# Patient Record
Sex: Female | Born: 1950 | Race: White | Hispanic: No | Marital: Married | Smoking: Current every day smoker
Health system: Southern US, Community
[De-identification: ages and names within clinical notes are randomized; demographics above are authoritative.]

## PROBLEM LIST (undated history)

## (undated) DIAGNOSIS — M797 Fibromyalgia: Secondary | ICD-10-CM

## (undated) DIAGNOSIS — G4733 Obstructive sleep apnea (adult) (pediatric): Secondary | ICD-10-CM

## (undated) DIAGNOSIS — K219 Gastro-esophageal reflux disease without esophagitis: Secondary | ICD-10-CM

## (undated) DIAGNOSIS — G2581 Restless legs syndrome: Secondary | ICD-10-CM

## (undated) HISTORY — PX: OTHER SURGICAL HISTORY: SHX169

## (undated) HISTORY — PX: HERNIA REPAIR: SHX51

## (undated) HISTORY — PX: TOTAL ABDOMINAL HYSTERECTOMY W/ BILATERAL SALPINGOOPHORECTOMY: SHX83

## (undated) HISTORY — PX: ENTEROCELE REPAIR: SHX623

---

## 2015-09-02 ENCOUNTER — Emergency Department (HOSPITAL_COMMUNITY): Payer: BC Managed Care – PPO

## 2015-09-02 ENCOUNTER — Observation Stay (HOSPITAL_COMMUNITY)
Admission: EM | Admit: 2015-09-02 | Discharge: 2015-09-03 | Disposition: A | Discharge status: Home / Self Care | Payer: BC Managed Care – PPO | Attending: Internal Medicine | Admitting: Internal Medicine

## 2015-09-02 ENCOUNTER — Encounter (HOSPITAL_COMMUNITY): Payer: Self-pay | Admitting: Emergency Medicine

## 2015-09-02 DIAGNOSIS — Z6841 Body Mass Index (BMI) 40.0 and over, adult: Secondary | ICD-10-CM | POA: Diagnosis not present

## 2015-09-02 DIAGNOSIS — G454 Transient global amnesia: Secondary | ICD-10-CM | POA: Diagnosis not present

## 2015-09-02 DIAGNOSIS — K219 Gastro-esophageal reflux disease without esophagitis: Secondary | ICD-10-CM

## 2015-09-02 DIAGNOSIS — G2581 Restless legs syndrome: Secondary | ICD-10-CM

## 2015-09-02 DIAGNOSIS — R410 Disorientation, unspecified: Principal | ICD-10-CM

## 2015-09-02 DIAGNOSIS — G4733 Obstructive sleep apnea (adult) (pediatric): Secondary | ICD-10-CM | POA: Diagnosis not present

## 2015-09-02 DIAGNOSIS — M797 Fibromyalgia: Secondary | ICD-10-CM | POA: Diagnosis not present

## 2015-09-02 DIAGNOSIS — F419 Anxiety disorder, unspecified: Secondary | ICD-10-CM | POA: Insufficient documentation

## 2015-09-02 DIAGNOSIS — W11XXXA Fall on and from ladder, initial encounter: Secondary | ICD-10-CM | POA: Insufficient documentation

## 2015-09-02 DIAGNOSIS — R4182 Altered mental status, unspecified: Secondary | ICD-10-CM | POA: Diagnosis present

## 2015-09-02 DIAGNOSIS — Z8673 Personal history of transient ischemic attack (TIA), and cerebral infarction without residual deficits: Secondary | ICD-10-CM | POA: Insufficient documentation

## 2015-09-02 DIAGNOSIS — R55 Syncope and collapse: Secondary | ICD-10-CM | POA: Insufficient documentation

## 2015-09-02 DIAGNOSIS — Z9989 Dependence on other enabling machines and devices: Secondary | ICD-10-CM

## 2015-09-02 HISTORY — DX: Gastro-esophageal reflux disease without esophagitis: K21.9

## 2015-09-02 HISTORY — DX: Restless legs syndrome: G25.81

## 2015-09-02 HISTORY — DX: Morbid (severe) obesity due to excess calories: E66.01

## 2015-09-02 HISTORY — DX: Fibromyalgia: M79.7

## 2015-09-02 HISTORY — DX: Obstructive sleep apnea (adult) (pediatric): G47.33

## 2015-09-02 LAB — COMPREHENSIVE METABOLIC PANEL
ALT: 9 U/L — ABNORMAL LOW (ref 14–54)
AST: 20 U/L (ref 15–41)
Albumin: 3.8 g/dL (ref 3.5–5.0)
Alkaline Phosphatase: 95 U/L (ref 38–126)
Anion gap: 12 (ref 5–15)
BUN: 18 mg/dL (ref 6–20)
CO2: 24 mmol/L (ref 22–32)
Calcium: 9.3 mg/dL (ref 8.9–10.3)
Chloride: 106 mmol/L (ref 101–111)
Creatinine, Ser: 0.78 mg/dL (ref 0.44–1.00)
GFR calc Af Amer: >60 mL/min (ref >60)
GFR calc non Af Amer: >60 mL/min (ref >60)
Glucose, Bld: 116 mg/dL — ABNORMAL HIGH (ref 65–99)
Potassium: 4.4 mmol/L (ref 3.5–5.1)
Sodium: 142 mmol/L (ref 135–145)
Total Bilirubin: 0.4 mg/dL (ref 0.3–1.2)
Total Protein: 7.6 g/dL (ref 6.5–8.1)

## 2015-09-02 LAB — CBC WITH DIFFERENTIAL/PLATELET
Basophils Absolute: 0 10*3/uL (ref 0.0–0.1)
Basophils Relative: 0 %
Eosinophils Absolute: 0 10*3/uL (ref 0.0–0.7)
Eosinophils Relative: 0 %
HCT: 40 % (ref 36.0–46.0)
Hemoglobin: 12.9 g/dL (ref 12.0–15.0)
Lymphocytes Relative: 8 %
Lymphs Abs: 0.7 10*3/uL (ref 0.7–4.0)
MCH: 31.2 pg (ref 26.0–34.0)
MCHC: 32.3 g/dL (ref 30.0–36.0)
MCV: 96.6 fL (ref 78.0–100.0)
Monocytes Absolute: 0.3 10*3/uL (ref 0.1–1.0)
Monocytes Relative: 4 %
Neutro Abs: 8 10*3/uL — ABNORMAL HIGH (ref 1.7–7.7)
Neutrophils Relative %: 88 %
Platelets: 198 10*3/uL (ref 150–400)
RBC: 4.14 MIL/uL (ref 3.87–5.11)
RDW: 13.1 % (ref 11.5–15.5)
WBC: 9 10*3/uL (ref 4.0–10.5)

## 2015-09-02 LAB — URINALYSIS, ROUTINE W REFLEX MICROSCOPIC
Bilirubin Urine: NEGATIVE
Glucose, UA: NEGATIVE mg/dL
Hgb urine dipstick: NEGATIVE
Ketones, ur: NEGATIVE mg/dL
Leukocytes, UA: NEGATIVE
Nitrite: NEGATIVE
Protein, ur: NEGATIVE mg/dL
Specific Gravity, Urine: 1.014 (ref 1.005–1.030)
pH: 5.5 (ref 5.0–8.0)

## 2015-09-02 LAB — RAPID URINE DRUG SCREEN, HOSP PERFORMED
Amphetamines: NOT DETECTED
Barbiturates: NOT DETECTED
Benzodiazepines: NOT DETECTED
Cocaine: NOT DETECTED
Opiates: NOT DETECTED
Tetrahydrocannabinol: NOT DETECTED

## 2015-09-02 LAB — AMMONIA: Ammonia: 20 umol/L (ref 9–35)

## 2015-09-02 MED ORDER — ACETAMINOPHEN 325 MG PO TABS
650.0000 mg | ORAL_TABLET | Freq: Four times a day (QID) | ORAL | Status: DC | PRN
Start: 2015-09-02 — End: 2015-09-03
  Filled 2015-09-02: qty 2

## 2015-09-02 MED ORDER — ONDANSETRON HCL 4 MG/2ML IJ SOLN: 4.0000 mg | Freq: Four times a day (QID) | INTRAMUSCULAR | Status: DC | PRN

## 2015-09-02 MED ORDER — ENOXAPARIN SODIUM 40 MG/0.4ML ~~LOC~~ SOLN
40.0000 mg | SUBCUTANEOUS | Status: DC
  Administered 2015-09-02: 40 mg via SUBCUTANEOUS
  Filled 2015-09-02: qty 0.4

## 2015-09-02 MED ORDER — CARBIDOPA-LEVODOPA 25-100 MG PO TABS
1.0000 | ORAL_TABLET | ORAL | Status: DC
  Administered 2015-09-03: 1 via ORAL
  Filled 2015-09-02: qty 1

## 2015-09-02 MED ORDER — SERTRALINE HCL 100 MG PO TABS
100.0000 mg | ORAL_TABLET | Freq: Two times a day (BID) | ORAL | Status: DC
  Administered 2015-09-02 – 2015-09-03 (×2): 100 mg via ORAL
  Filled 2015-09-02 (×2): qty 1

## 2015-09-02 MED ORDER — ACETAMINOPHEN 650 MG RE SUPP: 650.0000 mg | Freq: Four times a day (QID) | RECTAL | Status: DC | PRN

## 2015-09-02 MED ORDER — ONDANSETRON HCL 4 MG PO TABS: 4.0000 mg | ORAL_TABLET | Freq: Four times a day (QID) | ORAL | Status: DC | PRN

## 2015-09-02 MED ORDER — CARBIDOPA-LEVODOPA 25-100 MG PO TABS
3.0000 | ORAL_TABLET | Freq: Every day | ORAL | Status: DC
  Administered 2015-09-02: 3 via ORAL
  Filled 2015-09-02: qty 3

## 2015-09-02 NOTE — ED Notes (Signed)
Pt oob to br with steady gait 

## 2015-09-02 NOTE — Consult Note (Signed)
Neurology Consultation Reason for Consult: Memory problems Referring Physician: Desiree LucyMessner, J  CC: Memory problems  History is obtained from: Patient, family  HPI: Kathy Lawrence is a 65 y.o. female who was in her normal state of health this morning, and then around 10:30 or 11 her daughter noticed that she was asking the same questions over and over again. She did not remember falling the day prior, and therefore the family called EMS. They state that she seems pretty close to her normal self except that she continues asking the same questions over and over again and seems confused due to this.  They state that she now seems to be getting somewhat better, but still is asking the same questions at times.   LKW: 10:30 AM tpa given?: no, mild symptoms    ROS: A 14 point ROS was performed and is negative except as noted in the HPI.   Medical history: Restless legs  Family history: No history of similar  Social History:  reports that she has been smoking.  She does not have any smokeless tobacco history on file. She reports that she drinks alcohol. She reports that she does not use illicit drugs.   Exam: Current vital signs: BP 127/109 mmHg  Pulse 51  Temp(Src) 98.5 F (36.9 C) (Oral)  Resp 18  SpO2 98% Vital signs in last 24 hours: Temp:  [98.5 F (36.9 C)] 98.5 F (36.9 C) (04/12 1430) Pulse Rate:  [51-63] 51 (04/12 1735) Resp:  [15-23] 18 (04/12 1735) BP: (106-127)/(48-109) 127/109 mmHg (04/12 1735) SpO2:  [97 %-100 %] 98 % (04/12 1735)   Physical Exam  Constitutional: Appears well-developed and well-nourished.  Psych: Affect appropriate to situation Eyes: No scleral injection HENT: No OP obstrucion Head: Normocephalic.  Cardiovascular: Normal rate and regular rhythm.  Respiratory: Effort normal and breath sounds normal to anterior ascultation GI: Soft.  No distension. There is no tenderness.  Skin: WDI  Neuro: Mental Status: Patient is awake, alert, oriented to  person, place, month, and situation. She gives year as 2014 Patient is able to give a clear and coherent history. No signs of aphasia or neglect Cranial Nerves: II: Visual Fields are full. Pupils are equal, round, and reactive to light.   III,IV, VI: EOMI without ptosis or diploplia.  V: Facial sensation is symmetric to temperature VII: Facial movement is symmetric.  VIII: hearing is intact to voice X: Uvula elevates symmetrically XI: Shoulder shrug is symmetric. XII: tongue is midline without atrophy or fasciculations.  Motor: Tone is normal. Bulk is normal. 5/5 strength was present in all four extremities.  Sensory: Sensation is symmetric to light touch and temperature in the arms and legs. Deep Tendon Reflexes: 2+ and symmetric in the biceps and patellae.  Plantars: Toes are downgoing bilaterally.  Cerebellar: FNF and HKS are intact bilaterally   I have reviewed labs in epic and the results pertinent to this consultation are: CMP-unremarkable  I have reviewed the images obtained: CT head-unremarkable  Impression: 65 year old female with severe anterograde memory loss with limited retrograde memory loss as well with otherwise clear sensorium. 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 I do think that an MRI is prudent, but if this is normal and the patient comes back to baseline then I do not feel that any further evaluation as needed.  Recommendations: 1) MRI brain 2) the patient has a negative MRI and returns to normal, then she could be discharged home 3) if the patient remains with short-term  memory loss, I would favor observation until the patient is normal. 4) if there is a stroke on the MRI, then further evaluation for stroke would be indicated    Ritta Slot, MD [facility] [phone]  If 7pm- 7am, please page neurology on call as listed in [facility]ION.

## 2015-09-02 NOTE — ED Notes (Signed)
[facility] with cc of altered level of consciousness. Per EMS report, pt had unwitnessed fall yesterday in her kitchen. Pt unable to recall the event, but is a&ox3 currently with periods of confusion. Per EMS, pt also reporting nausea, no vomitting and has bruise on L Shoulder. Last known normal 1030 this morning per daughter and husband.

## 2015-09-02 NOTE — ED Provider Notes (Signed)
CSN: [id number]     Arrival date & time 09/02/15  1416 History  By signing my name below, I, Tanda RockersMargaux Venter, attest that this documentation has been prepared under the direction and in the presence of Mohawk IndustriesJeff Braeley Buskey, PA-C. Electronically Signed: Tanda RockersMargaux Venter, ED Scribe. 09/02/2015. 3:20 PM.   Chief Complaint  Patient presents with  . Altered Mental Status   The history is provided by the patient, the spouse and a relative. No language interpreter was used.     HPI Comments:  Kathy Lawrence is a 65 y.o. female brought in by ambulance, with no significant PMHx who presents to the Emergency Department for AMS that began this morning. Family reports that pt fell yesterday around 3:30 PM. She was on a small step ladder attempting to reach something off of a high shelf when she fell and landed onto her left shoulder. Husband found pt on the floor complaining of left shoulder pain. Pt did not recall falling at that time and is unsure if she hit her head or had LOC. Husband states that afterwards pt seemed fine. She was not complaining of a headache or any symptoms at that time besides her shoulder pain. Upon waking up this morning pt seemed at baseline. She woke up and made toast for herself but did not eat it. Daughter states that when she saw pt around 10:30 AM she seemed slightly confused but also sick with vomiting/dry heaving. Daughter kept checking up on pt throughout the day and pt seemed to be getting more and more confused. Daughter mentions that pt kept asking her the same questions over and over again. Pt could not recall what day or month it was but did know who the president is and what her own name was, prompting family to call EMS.  Family reports that pt was ambulating without difficulty this morning. Pt does not recall family asking her any questions. She states that she feels fine at the moment. Pt has never had symptoms like this in the past. Denies headache, dizziness, abdominal pain,  diarrhea, chest pain, leg swelling, fever, cough, or any other associated symptoms. She is an occasional EtOH drinker but denies any consumption recently. Denies any illicit drug use. Pt is a current everyday smoker who smokes approximately 0.25 ppd. No hx HTN, HLD, DM, CVA, MI. Pt is not on any anticoagulants.    Past Medical History  Diagnosis Date  . Fibromyalgia   . OSA (obstructive sleep apnea)   . Morbid obesity (HCC)   . GERD (gastroesophageal reflux disease)   . Restless leg syndrome    Past Surgical History  Procedure Laterality Date  . Cesarean section    . Hernia repair    . Enterocele repair    . Mid-urethral sling procedure    . Total abdominal hysterectomy w/ bilateral salpingoophorectomy     Family History  Problem Relation Age of Onset  . Cancer Mother   . Cancer Father   . Breast cancer Other   . Stroke Paternal Grandfather    Social History  Substance Use Topics  . Smoking status: Current Every Day Smoker -- 0.50 packs/day  . Smokeless tobacco: None  . Alcohol Use: Yes     Comment: occasional   OB History    No data available     Review of Systems  Constitutional: Negative for fever.  Respiratory: Negative for cough.   Cardiovascular: Negative for chest pain and leg swelling.  Gastrointestinal: Positive for vomiting. Negative for abdominal pain  and diarrhea.  Musculoskeletal: Positive for arthralgias (left shoulder).  Neurological: Negative for dizziness and headaches.  All other systems reviewed and are negative.  Allergies  Codeine and Erythromycin  Home Medications   Prior to Admission medications   Medication Sig Start Date End Date Taking? Authorizing Provider  Ascorbic Acid (VITAMIN C) 1000 MG tablet Take 1,000 mg by mouth daily.   Yes Historical Provider, MD  carbidopa-levodopa (SINEMET IR) 25-100 MG tablet Take 1-3 tablets by mouth See admin instructions. Takes 1 tab in am and 1 tab in noon and 3 tabs in evening 08/28/15  Yes Historical  Provider, MD  Cholecalciferol (VITAMIN D-1000 MAX ST) 1000 units tablet Take 1,000 Units by mouth daily.   Yes Historical Provider, MD  Homeopathic Products (LEG CRAMP RELIEF) TABS Take 1 tablet by mouth daily.   Yes Historical Provider, MD  ibuprofen (ADVIL,MOTRIN) 200 MG tablet Take 400 mg by mouth every 6 (six) hours as needed for headache or moderate pain.   Yes Historical Provider, MD  LORazepam (ATIVAN) 1 MG tablet Take 1 mg by mouth every 8 (eight) hours as needed for anxiety or sleep.   Yes Historical Provider, MD  Multiple Vitamin (MULTIVITAMIN WITH MINERALS) TABS tablet Take 1 tablet by mouth daily.   Yes Historical Provider, MD  sertraline (ZOLOFT) 100 MG tablet Take 100 mg by mouth 2 (two) times daily. 09/01/15  Yes Historical Provider, MD  vitamin B-12 (CYANOCOBALAMIN) 1000 MCG tablet Take 1,000 mcg by mouth daily.   Yes Historical Provider, MD   BP 99/40 mmHg  Pulse 56  Temp(Src) 98.9 F (37.2 C) (Oral)  Resp 20  Ht  (1.626 m)  Wt 126.417 kg  BMI 47.82 kg/m2  SpO2 96%   Physical Exam  Constitutional: She appears well-developed and well-nourished. No distress.  HENT:  Head: Normocephalic and atraumatic.  Eyes: Conjunctivae and EOM are normal.  Neck: Neck supple. No tracheal deviation present.  Cardiovascular: Normal rate.   Pulmonary/Chest: Effort normal. No respiratory distress. She has rales (right lung).  Very fine crackles right lower lung  Musculoskeletal: Normal range of motion.  Neurological: She is alert. She has normal strength. No cranial nerve deficit or sensory deficit. She displays a negative Romberg sign. Coordination normal. GCS eye subscore is 4. GCS verbal subscore is 5. GCS motor subscore is 6.  Reflex Scores:      Patellar reflexes are 2+ on the right side and 2+ on the left side. Oriented to person, place   Skin: Skin is warm and dry.  Psychiatric: She has a normal mood and affect. Her behavior is normal.  Nursing note and vitals  reviewed.   ED Course  Procedures (including critical care time)  DIAGNOSTIC STUDIES: Oxygen Saturation is 99% on RA, normal by my interpretation.    COORDINATION OF CARE: 2:45 PM-Discussed treatment plan which includes admission to the hospital with pt at bedside and pt agreed to plan.   Labs Review Labs Reviewed  CBC WITH DIFFERENTIAL/PLATELET - Abnormal; Notable for the following:    Neutro Abs 8.0 (*)    All other components within normal limits  COMPREHENSIVE METABOLIC PANEL - Abnormal; Notable for the following:    Glucose, Bld 116 (*)    ALT 9 (*)    All other components within normal limits  URINALYSIS, ROUTINE W REFLEX MICROSCOPIC (NOT AT [facility]) - Abnormal; Notable for the following:    APPearance HAZY (*)    All other components within normal limits  AMMONIA  URINE RAPID DRUG SCREEN, HOSP PERFORMED    Imaging Review Dg Chest 2 View  09/02/2015  CLINICAL DATA:  Altered mental status. EXAM: CHEST  2 VIEW COMPARISON:  None. FINDINGS: The heart size and mediastinal contours are within normal limits. Both lungs are clear. The visualized skeletal structures are unremarkable. IMPRESSION: No active cardiopulmonary disease. Electronically Signed   By: Lupita Raider, M.D.   On: 09/02/2015 15:52   Ct Head Wo Contrast  09/02/2015  CLINICAL DATA:  Larey Seat 2 hours ago after syncopal episode. Trauma to the head. EXAM: CT HEAD WITHOUT CONTRAST TECHNIQUE: Contiguous axial images were obtained from the base of the skull through the vertex without intravenous contrast. COMPARISON:  None. FINDINGS: The brain has normal appearance without evidence of atrophy, old or acute infarction, mass lesion, hemorrhage, hydrocephalus or extra-axial collection. No skull fracture. Sinuses are clear except for mucosal thickening of the right maxillary sinus. IMPRESSION: Normal head CT. Right maxillary sinus mucosal inflammatory change. Electronically Signed   By: Paulina Fusi M.D.   On: 09/02/2015 16:01   Mr  Brain Wo Contrast  09/02/2015  CLINICAL DATA:  Fall from ladder yesterday with possible loss of consciousness. New onset confusion with nausea and vomiting today. EXAM: MRI HEAD WITHOUT CONTRAST TECHNIQUE: Multiplanar, multiecho pulse sequences of the brain and surrounding structures were obtained without intravenous contrast. COMPARISON:  CT head without contrast from the same day. FINDINGS: Mild periventricular T2 changes bilaterally are advanced for age. Remote lacunar infarcts are present within the basal ganglia bilaterally. The ventricles are of normal size. No acute infarct, hemorrhage, or mass lesion is present. No significant extraaxial fluid collection is present. White matter changes extend into the brainstem. Flow is present in the major intracranial arteries. The internal auditory canals are normal bilaterally. The globes and orbits are intact. A fluid level is present in the right maxillary sinus. There is some mucosal thickening of the right maxillary sinus is well. The remaining paranasal sinuses and the mastoid air cells are clear. Skullbase is within normal limits. Midline sagittal images are unremarkable. IMPRESSION: 1. No acute intracranial abnormality. 2. Mild periventricular white matter changes bilaterally are slightly advanced for age. The finding is nonspecific but can be seen in the setting of chronic microvascular ischemia, a demyelinating process such as multiple sclerosis, vasculitis, complicated migraine headaches, or as the sequelae of a prior infectious or inflammatory process. 3. 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 Electronically Signed   By: Marin Roberts M.D.   On: 09/02/2015 18:30   I have personally reviewed and evaluated these images and lab results as part of my medical decision-making.   EKG Interpretation None      MDM   Final diagnoses:  Altered mental status    Labs: CBC with Differential, CMP, Ammonia, UA  Imaging: CT Head,  CXR  Consults: 7:51 PM - Discussed case with Dr. Maryfrances Bunnell who will admit pt for observation.  Therapeutics:  Discharge Meds:   Assessment/Plan: 65 year old female presents today with altered mental status. Patient had a fall yesterday, no head trauma noted after the fall. She was completely normal after the fall and up until today. Patient has had altered mental status since approximately 9 AM this morning when she was first encountered by her daughter. She has no sensory strength or motor deficits on physical exam. Patient continues to forget who I am when I enter the room, she has a very pleasant disposition, she does know why she is here, but  cannot recall the events that her family are telling her about. Patient has no infectious etiology on her exam here in the ED. Due to persistence of symptoms neurology was consult and who will evaluate patient here in the ED.   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 Patient's MRI showed no acute findings, patient continued to show signs of amnesia, but with no worsening or improvement in his symptoms. Hospitalist service was consult for admission and further evaluation.  No signs of infectious, chemical, traumatic etiology here today.    I personally performed the services described in this documentation, which was scribed in my presence. The recorded information has been reviewed and is accurate.     Eyvonne Mechanic, PA-C 09/02/15 1610  Gwyneth Sprout, MD 09/03/15 2238

## 2015-09-02 NOTE — ED Notes (Signed)
Patient transported to X-ray 

## 2015-09-02 NOTE — H&P (Signed)
History and Physical  Patient Name: Kathy Lawrence     ZOX:[id number]    DOB: Jun 29, 1950    DOA: 09/02/2015 Referring physician: Burna Forts, PA-C PCP: No primary care provider on file.      Chief Complaint: Confusion  HPI: Kathy Lawrence is a 65 y.o. female with a past medical history significant for OSA on CPAP, MO, RLS and fibromyalgia who presents with acute confusion for one day.  Her usual state of health until this morning after she got up and she started to feel nauseous. Family relate that daughter found her in the bathroom dry heaving and also confused, "kept asking the same question over and over again", "didn't know what month it was", and "sounded drunk".  They were concerned for stroke, and called 9-1-1.  The patient denied fever, headache, chills, focal weakness, numbness, syncope, loss of consciousness. She had fallen the night before from a stepladder, but appears to be because she was trying to reach for something to far away. She did stay on the floor for about 45 minutes after that until her husband came home, but they both report this is normal for her because of knee pain. She has never had amnesia before. She denies any new medicines. She takes lorazepam and was recently prescribed clonazepam instead, but doesn't take them both at the same time, and has taken none of them recently she thinks. Denies alcohol use.  In the ED, the patient was initially somewhat disoriented to place/time and to her providers in the ER.  She was afebrile, hemodynamically stable.  Na 142, K 4.4, Cr 0.8, LFT normal, WBC 9K, Hgb 12.9, UA without pyuria or bacteria, CXR clear, UDS negative (including a benzodiazepines), alcohol negative.  CT head without contrast was negative. Neurology were consulted who recommended MRI brain and observation until mental status clears. MR brain showed nonspecific periventricular white matter changes consistent with chronic small vessel disease, without focal or specific  findings, and TRH were asked to observ overnight.     Review of Systems:  All other systems negative except as just noted or noted in the history of present illness.  Allergies  Allergen Reactions  . Codeine Other (See Comments)    hallucinations  . Erythromycin Nausea And Vomiting and Rash    Prior to Admission medications   Medication Sig Start Date End Date Taking? Authorizing Provider  Ascorbic Acid (VITAMIN C) 1000 MG tablet Take 1,000 mg by mouth daily.   Yes Historical Provider, MD  carbidopa-levodopa (SINEMET IR) 25-100 MG tablet Take 1-3 tablets by mouth See admin instructions. Takes 1 tab in am and 1 tab in noon and 3 tabs in evening 08/28/15  Yes Historical Provider, MD  Cholecalciferol (VITAMIN D-1000 MAX ST) 1000 units tablet Take 1,000 Units by mouth daily.   Yes Historical Provider, MD  Homeopathic Products (LEG CRAMP RELIEF) TABS Take 1 tablet by mouth daily.   Yes Historical Provider, MD  ibuprofen (ADVIL,MOTRIN) 200 MG tablet Take 400 mg by mouth every 6 (six) hours as needed for headache or moderate pain.   Yes Historical Provider, MD  LORazepam (ATIVAN) 1 MG tablet Take 1 mg by mouth every 8 (eight) hours as needed for anxiety or sleep.   Yes Historical Provider, MD  Multiple Vitamin (MULTIVITAMIN WITH MINERALS) TABS tablet Take 1 tablet by mouth daily.   Yes Historical Provider, MD  sertraline (ZOLOFT) 100 MG tablet Take 100 mg by mouth 2 (two) times daily. 09/01/15  Yes Historical Provider, MD  vitamin B-12 (CYANOCOBALAMIN) 1000 MCG tablet Take 1,000 mcg by mouth daily.   Yes Historical Provider, MD    Past Medical History  Diagnosis Date  . Fibromyalgia   . OSA (obstructive sleep apnea)   . Morbid obesity (HCC)   . GERD (gastroesophageal reflux disease)   . Restless leg syndrome     Past Surgical History  Procedure Laterality Date  . Cesarean section    . Hernia repair    . Enterocele repair    . Mid-urethral sling procedure    . Total abdominal  hysterectomy w/ bilateral salpingoophorectomy      Family history: family history includes Breast cancer in her other; Cancer in her father and mother; Stroke in her paternal grandfather.  Social History: Patient lives with her husband and three children.  She denies alcohol use. She denies illicit drugs. She works as a part-time Armed forces operational officerdental hygienist. She has no history of dementia.       Physical Exam: BP 99/42 mmHg  Pulse 51  Temp(Src) 98.5 F (36.9 C) (Oral)  Resp 16  SpO2 95% General appearance: Well-developed, obese adult female, alert and in no acute distress.  Conversational. Oriented now.   Eyes: Anicteric, conjunctiva pink, lids and lashes normal.     ENT: No nasal deformity, discharge, or epistaxis.  OP moist without lesions.   Lymph: No cervical or supraclavicular lymphadenopathy. Skin: Warm and dry.  Rosacea on face.  No jaundice.  No suspicious rashes or lesions on neck, chest, abdomen, legs or arms. Cardiac: RRR, nl S1-S2, no murmurs appreciated.  Capillary refill is brisk.  JVP not visible.  No LE edema.  Radial pulses 2+ and symmetric.  No carotid bruits. Respiratory: Normal respiratory rate and rhythm.  CTAB without rales or wheezes. Abdomen: Abdomen soft without rigidity.  No TTP. No ascites, distension.   MSK: No deformities or effusions. Neuro: Pupils are 5 mm and reactive to 2 mm.  Extraocular movements are intact, without nystagmus.  Cranial nerve 5 is within normal limits.  Cranial nerve 7 is symmetrical.  Cranial nerve 8 is within normal limits.  Cranial nerves 9 and 10 reveal equal palate elevation.  Cranial nerve 11 reveals sternocleidomastoid strong.  Cranial nerve 12 is midline.  Motor strength testing is 5/5 in the upper and lower extremities bilaterally with normal motor, tone and bulk. Toes are downgoing bilaterally.  Sensory examination is intact to light touch and position.  Romberg maneuver is negative for pathology.  The patient is now oriented to time, place  and person.  Speech is fluent.  Naming is grossly intact.  Recall, recent and remote, as well as general fund of knowledge seem within normal limits with exception of poor memory of events from today.  Attention span and concentration are within normal limits. Psych: Behavior appropriate.  Affect normal.  No evidence of aural or visual hallucinations or delusions.       Labs on Admission:  The metabolic panel shows normal lites collected and renal function. Transaminases and bilirubin are normal. Ammonia normal. Urine drug screen without benzodiazepines or other illicit drugs Urinalysis clear. The complete blood count shows no leukocytosis, anemia, thrombocytopenia.   Radiological Exams on Admission: Personally reviewed: Dg Chest 2 View 09/02/2015  IMPRESSION: No active cardiopulmonary disease.    Ct Head Wo Contrast 09/02/2015  IMPRESSION: Normal head CT. Right maxillary sinus mucosal inflammatory change.    Mr Brain Wo Contrast 09/02/2015  IMPRESSION: 1. No acute intracranial abnormality. 2. Mild periventricular white matter changes  bilaterally are slightly advanced for age. The finding is nonspecific but can be seen in the setting of chronic microvascular ischemia, a demyelinating process such as multiple sclerosis, vasculitis, complicated migraine headaches, or as the sequelae of a prior infectious or inflammatory process. 3. 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 Electronically Signed   By: Marin Roberts M.D.   On: 09/02/2015 18:30       Assessment/Plan 1. Amnesia, confusion:  This is new.  TGA suspected.  No source of delirium clear, specifically, no new medications/alcohol/toxins, no medication overuse, electrolytes normal, no anemia, MRI brain without specific findings, no evidence of infection.  Discussed with on-call Neurology overnight, who doubt non-specific white matter changes on MRI warrant LP or further workup, for which I agree, given they  do not correlate to patient's symptoms. -Avoid benzodiazepines    2. OSA:  -Continue home CPAP  3. Restless leg syndrome:  -Continue home Sinemet  4. GERD:  -Continue home PPI  5. Anxiety:  -Hold all benzodiazepines while inpatient, so as not to cloud picture      DVT PPx: Lovenox Diet: Regular Consultants: Neurology Code Status: FULL Family Communication: Sons and husband present at bedside  Medical decision making: What exists of the patient's previous chart and outside records from [facility] were reviewed in depth and the case was discussed with Burna Forts and Dr. Roseanne Reno from Neurology. Patient seen 8:38 PM on 09/02/2015.  Disposition Plan:  I recommend admission to medical surgical bed, observation status.  Clinical condition: stable.  Anticipate observation overnight.  Will review MRI results with attending Neurologist in morning, and if agree with overnight coverage that no further workup necessary, and patient returns to baseline mental status without new fever, will discharge home tomorrow.      Alberteen Sam [facility] Pager [phone]

## 2015-09-02 NOTE — ED Notes (Signed)
Attempted to call report

## 2015-09-02 NOTE — ED Notes (Signed)
Returns from ct.

## 2015-09-02 NOTE — ED Notes (Signed)
Pt to MRI

## 2015-09-03 DIAGNOSIS — R410 Disorientation, unspecified: Secondary | ICD-10-CM | POA: Diagnosis not present

## 2015-09-03 DIAGNOSIS — G2581 Restless legs syndrome: Secondary | ICD-10-CM | POA: Diagnosis not present

## 2015-09-03 DIAGNOSIS — G4733 Obstructive sleep apnea (adult) (pediatric): Secondary | ICD-10-CM | POA: Diagnosis not present

## 2015-09-03 MED ORDER — ASPIRIN 81 MG PO TBEC: 81.0000 mg | DELAYED_RELEASE_TABLET | Freq: Every day | ORAL | Status: DC

## 2015-09-03 MED ORDER — ASPIRIN EC 81 MG PO TBEC: 81.0000 mg | DELAYED_RELEASE_TABLET | Freq: Every day | ORAL | Status: DC

## 2015-09-03 NOTE — Discharge Summary (Signed)
[facility] Discharge Summary   Patient: Kathy Lawrence BJY:[id number]   PCP: Raynelle Jan., MD DOB: 1950-12-06   Date of admission: 09/02/2015   Date of discharge:  09/03/2015    Discharge Diagnoses:  Principal Problem:   Confusion Active Problems:   OSA on CPAP   Restless leg syndrome   GERD (gastroesophageal reflux disease)  Recommendations for Outpatient Follow-up:  1. Follow-up with PCP in one week  2. Do not drive or operate any machinery until cleared by PCP  Follow-up Information    Follow up with Raynelle Jan., MD. Schedule an appointment as soon as possible for a visit in 1 week.   Specialty:  Family Medicine   Contact information:   [address] [place] [address] [phone]      Diet recommendation: Cardiac diet  Activity: The patient is advised to gradually reintroduce usual activities.  Discharge Condition: good  History of present illness: As per the H and P dictated on admission, "Kathy Lawrence is a 65 y.o. female with a past medical history significant for OSA on CPAP, MO, RLS and fibromyalgia who presents with acute confusion for one day.  Her usual state of health until this morning after she got up and she started to feel nauseous. Family relate that daughter found her in the bathroom dry heaving and also confused, "kept asking the same question over and over again", "didn't know what month it was", and "sounded drunk". They were concerned for stroke, and called 9-1-1.  The patient denied fever, headache, chills, focal weakness, numbness, syncope, loss of consciousness. She had fallen the night before from a stepladder, but appears to be because she was trying to reach for something to far away. She did stay on the floor for about 45 minutes after that until her husband came home, but they both report this is normal for her because of knee pain. She has never had amnesia before. She denies any new medicines. She takes lorazepam and was  recently prescribed clonazepam instead, but doesn't take them both at the same time, and has taken none of them recently she thinks. Denies alcohol use.  In the ED, the patient was initially somewhat disoriented to place/time and to her providers in the ER. She was afebrile, hemodynamically stable. Na 142, K 4.4, Cr 0.8, LFT normal, WBC 9K, Hgb 12.9, UA without pyuria or bacteria, CXR clear, UDS negative (including a benzodiazepines), alcohol negative. CT head without contrast was negative. Neurology were consulted who recommended MRI brain and observation until mental status clears. MR brain showed nonspecific periventricular white matter changes consistent with chronic small vessel disease, without focal or specific findings, and TRH were asked to observ overnight."  Hospital Course:  Summary of her active problems in the hospital is as following.  Principal Problem:   Confusion Significant neurological deficit. Likely associated with medication. MRI is negative for any evidence of stroke or any other acute abnormality. Patient appears to have prior evidence of lacunar stroke and therefore I will discharge the patient on aspirin.  Active Problems:   OSA on CPAP Continue Cipro. Recommend patient to follow up with sleep therapist as scheduled.    Restless leg syndrome current presentation could likely be secondary to medication. At present not making any changes but recommend PCP to evaluate for potential decrease in the dose of medications.  All other chronic medical condition were stable during the hospitalization.  Patient was ambulatory without any assistance. On the day of the discharge the patient's  mentation back to baseline, and no other acute medical condition were reported by patient. the patient was felt safe to be discharge at home with family.  Procedures and Results:  none   Consultations:  Neurology  DISCHARGE MEDICATION: Current Discharge Medication List      START taking these medications   Details  aspirin EC 81 MG EC tablet Take 1 tablet (81 mg total) by mouth daily. Qty: 30 tablet, Refills: 0      CONTINUE these medications which have NOT CHANGED   Details  Ascorbic Acid (VITAMIN C) 1000 MG tablet Take 1,000 mg by mouth daily.    carbidopa-levodopa (SINEMET IR) 25-100 MG tablet Take 1-3 tablets by mouth See admin instructions. Takes 1 tab in am and 1 tab in noon and 3 tabs in evening    Cholecalciferol (VITAMIN D-1000 MAX ST) 1000 units tablet Take 1,000 Units by mouth daily.    Homeopathic Products (LEG CRAMP RELIEF) TABS Take 1 tablet by mouth daily.    ibuprofen (ADVIL,MOTRIN) 200 MG tablet Take 400 mg by mouth every 6 (six) hours as needed for headache or moderate pain.    LORazepam (ATIVAN) 1 MG tablet Take 1 mg by mouth every 8 (eight) hours as needed for anxiety or sleep.    Multiple Vitamin (MULTIVITAMIN WITH MINERALS) TABS tablet Take 1 tablet by mouth daily.    sertraline (ZOLOFT) 100 MG tablet Take 100 mg by mouth 2 (two) times daily.    vitamin B-12 (CYANOCOBALAMIN) 1000 MCG tablet Take 1,000 mcg by mouth daily.       Allergies  Allergen Reactions  . Codeine Other (See Comments)    hallucinations  . Erythromycin Nausea And Vomiting and Rash   Discharge Instructions    Diet general    Complete by:  As directed      Discharge instructions    Complete by:  As directed   It is important that you read following instructions as well as go over your medication list with RN to help you understand your care after this hospitalization.  Discharge Instructions: Please follow-up with PCP in one week  Please request your primary care physician to go over all Hospital Tests and Procedure/Radiological results at the follow up,  Please get all Hospital records sent to your PCP by signing hospital release before you go home.   Do not drive, operating heavy machinery, perform activities at heights, swimming or participation  in water activities or provide baby sitting services while your are on Pain, Sleep and Anxiety Medications; until you have been seen by Primary Care Physician or a Neurologist and advised to do so again. Do not take more than prescribed Pain, Sleep and Anxiety Medications. You were cared for by a hospitalist during your hospital stay. If you have any questions about your discharge medications or the care you received while you were in the hospital after you are discharged, you can call the unit and ask to speak with the hospitalist on call if the hospitalist that took care of you is not available.  Once you are discharged, your primary care physician will handle any further medical issues. Please note that NO REFILLS for any discharge medications will be authorized once you are discharged, as it is imperative that you return to your primary care physician (or establish a relationship with a primary care physician if you do not have one) for your aftercare needs so that they can reassess your need for medications and monitor your lab values.  You Must read complete instructions/literature along with all the possible adverse reactions/side effects for all the Medicines you take and that have been prescribed to you. Take any new Medicines after you have completely understood and accept all the possible adverse reactions/side effects. Wear Seat belts while driving. If you have smoked or chewed Tobacco in the last 2 yrs please stop smoking and/or stop any Recreational drug use.     Increase activity slowly    Complete by:  As directed           Discharge Exam: Filed Weights   09/02/15 2108  Weight: 126.417 kg (278 lb 11.2 oz)   Filed Vitals:   09/03/15 0557 09/03/15 1019  BP: 104/45 90/48  Pulse: 62 60  Temp: 98.5 F (36.9 C) 99 F (37.2 C)  Resp: 20 18   General: Appear in no distress, no Rash; Oral Mucosa moist. Cardiovascular: S1 and S2 Present, no Murmur, no JVD Respiratory: Bilateral Air  entry present and Clear to Auscultation, no Crackles, no wheezes Abdomen: Bowel Sound present, Soft and no tenderness Extremities: no Pedal edema, no calf tenderness Neurology: Grossly no focal neuro deficit.  The results of significant diagnostics from this hospitalization (including imaging, microbiology, ancillary and laboratory) are listed below for reference.    Significant Diagnostic Studies: Dg Chest 2 View  09/02/2015  CLINICAL DATA:  Altered mental status. EXAM: CHEST  2 VIEW COMPARISON:  None. FINDINGS: The heart size and mediastinal contours are within normal limits. Both lungs are clear. The visualized skeletal structures are unremarkable. IMPRESSION: No active cardiopulmonary disease. Electronically Signed   By: Lupita RaiderJames  Green Jr, M.D.   On: 09/02/2015 15:52   Ct Head Wo Contrast  09/02/2015  CLINICAL DATA:  Larey SeatFell 2 hours ago after syncopal episode. Trauma to the head. EXAM: CT HEAD WITHOUT CONTRAST TECHNIQUE: Contiguous axial images were obtained from the base of the skull through the vertex without intravenous contrast. COMPARISON:  None. FINDINGS: The brain has normal appearance without evidence of atrophy, old or acute infarction, mass lesion, hemorrhage, hydrocephalus or extra-axial collection. No skull fracture. Sinuses are clear except for mucosal thickening of the right maxillary sinus. IMPRESSION: Normal head CT. Right maxillary sinus mucosal inflammatory change. Electronically Signed   By: Paulina FusiMark  Shogry M.D.   On: 09/02/2015 16:01   Mr Brain Wo Contrast  09/02/2015  CLINICAL DATA:  Fall from ladder yesterday with possible loss of consciousness. New onset confusion with nausea and vomiting today. EXAM: MRI HEAD WITHOUT CONTRAST TECHNIQUE: Multiplanar, multiecho pulse sequences of the brain and surrounding structures were obtained without intravenous contrast. COMPARISON:  CT head without contrast from the same day. FINDINGS: Mild periventricular T2 changes bilaterally are advanced  for age. Remote lacunar infarcts are present within the basal ganglia bilaterally. The ventricles are of normal size. No acute infarct, hemorrhage, or mass lesion is present. No significant extraaxial fluid collection is present. White matter changes extend into the brainstem. Flow is present in the major intracranial arteries. The internal auditory canals are normal bilaterally. The globes and orbits are intact. A fluid level is present in the right maxillary sinus. There is some mucosal thickening of the right maxillary sinus is well. The remaining paranasal sinuses and the mastoid air cells are clear. Skullbase is within normal limits. Midline sagittal images are unremarkable. IMPRESSION: 1. No acute intracranial abnormality. 2. Mild periventricular white matter changes bilaterally are slightly advanced for age. The finding is nonspecific but can be seen in the setting of chronic  microvascular ischemia, a demyelinating process such as multiple sclerosis, vasculitis, complicated migraine headaches, or as the sequelae of a prior infectious or inflammatory process. 3. 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 Electronically Signed   By: Marin Roberts M.D.   On: 09/02/2015 18:30    Microbiology: No results found for this or any previous visit (from the past 240 hour(s)).   Labs: CBC:  Recent Labs Lab 09/02/15 1502  WBC 9.0  NEUTROABS 8.0*  HGB 12.9  HCT 40.0  MCV 96.6  PLT 198   Basic Metabolic Panel:  Recent Labs Lab 09/02/15 1502  NA 142  K 4.4  CL 106  CO2 24  GLUCOSE 116*  BUN 18  CREATININE 0.78  CALCIUM 9.3   Liver Function Tests:  Recent Labs Lab 09/02/15 1502  AST 20  ALT 9*  ALKPHOS 95  BILITOT 0.4  PROT 7.6  ALBUMIN 3.8   No results for input(s): LIPASE, AMYLASE in the last 168 hours.  Recent Labs Lab 09/02/15 1501  AMMONIA 20   Cardiac Enzymes: No results for input(s): CKTOTAL, CKMB, CKMBINDEX, TROPONINI in the last 168 hours. BNP  (last 3 results) No results for input(s): BNP in the last 8760 hours. CBG: No results for input(s): GLUCAP in the last 168 hours. Time spent: 30 minutes  Signed:  Lynden Oxford  [facility]  09/03/2015  , 12:47 PM

## 2015-09-03 NOTE — Care Management Note (Addendum)
Case Management Note  Patient Details  Name: Josefine ClassSharon Edrington MRN: [id number] Date of Birth: [birth year]  Subjective/Objective:   Patient admitted with confusion. She is from home with her husband. [facility].                Action/Plan: Plan is for patient to discharge home today with self care. No further needs per CM.   Expected Discharge Date:  09/03/15               Expected Discharge Plan:  Home/Self Care  In-House Referral:     Discharge planning Services     Post Acute Care Choice:    Choice offered to:     DME Arranged:    DME Agency:     HH Arranged:    HH Agency:     Status of Service:  Completed, signed off  Medicare Important Message Given:    Date Medicare IM Given:    Medicare IM give by:    Date Additional Medicare IM Given:    Additional Medicare Important Message give by:     If discussed at Long Length of Stay Meetings, dates discussed:    Additional Comments:  Kermit BaloKelli F Symphonie Schneiderman, RN 09/03/2015, 11:35 AM

## 2015-09-03 NOTE — Progress Notes (Signed)
Pt discharged home with husband. IV removed. Discharge instructions given. [facility] called to patient's pharm. Pt questions asked and answered. Volunteer services to escort pt to exit via wheelchair. Kathy Lawrence

## 2018-07-13 ENCOUNTER — Other Ambulatory Visit: Payer: Self-pay | Admitting: Neurosurgery

## 2018-07-13 DIAGNOSIS — M5442 Lumbago with sciatica, left side: Secondary | ICD-10-CM

## 2018-07-22 ENCOUNTER — Ambulatory Visit
Admission: RE | Admit: 2018-07-22 | Discharge: 2018-07-22 | Disposition: A | Discharge status: Home / Self Care | Payer: Self-pay | Source: Ambulatory Visit | Attending: Neurosurgery | Admitting: Neurosurgery

## 2018-07-22 DIAGNOSIS — M5442 Lumbago with sciatica, left side: Secondary | ICD-10-CM

## 2021-02-05 IMAGING — MR MR LUMBAR SPINE W/O CM
4 of 5 series · 24 of 48 positions shown · non-contrast
Comparison: None available.

CLINICAL DATA: Initial evaluation for chronic low back pain
radiating into the lower extremities bilaterally with associated
weakness and numbness.

EXAM:
MRI LUMBAR SPINE WITHOUT CONTRAST
TECHNIQUE: Multiplanar, multisequence MR imaging of the lumbar spine was
performed. No intravenous contrast was administered.

[Series 4: T2 · sagittal · 4.0mm · 0.55mm/px · 6 of 15 slices shown (1 of 2)]
[im 1/15]
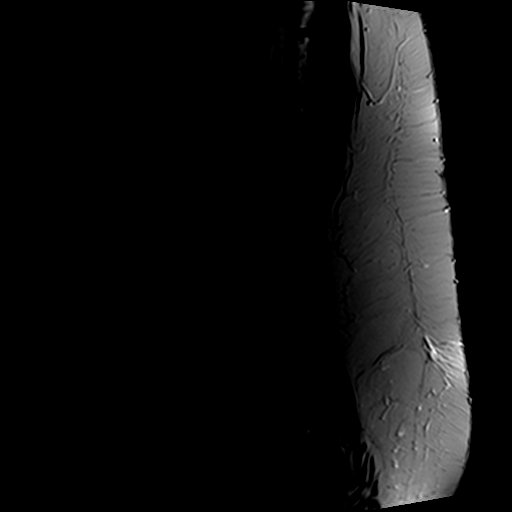
[im 3/15]
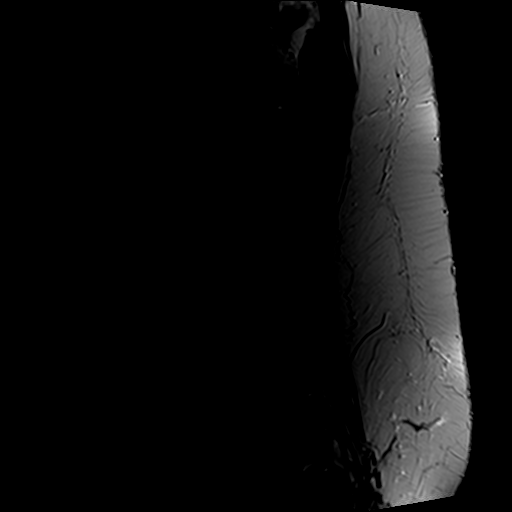
[im 6/15]
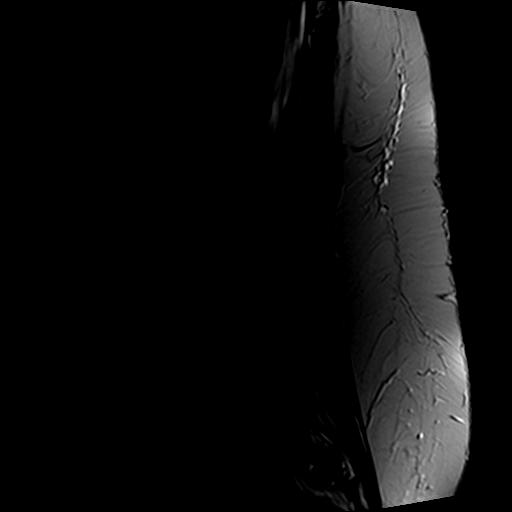
[im 9/15]
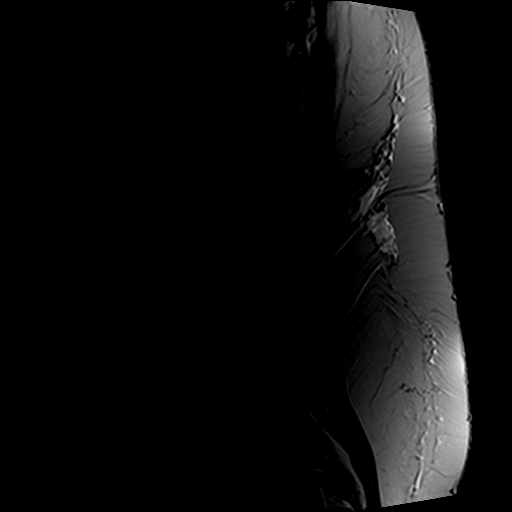
[im 12/15]
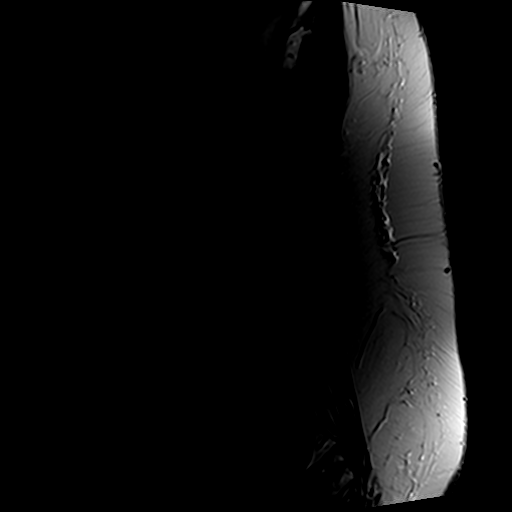
[im 15/15]
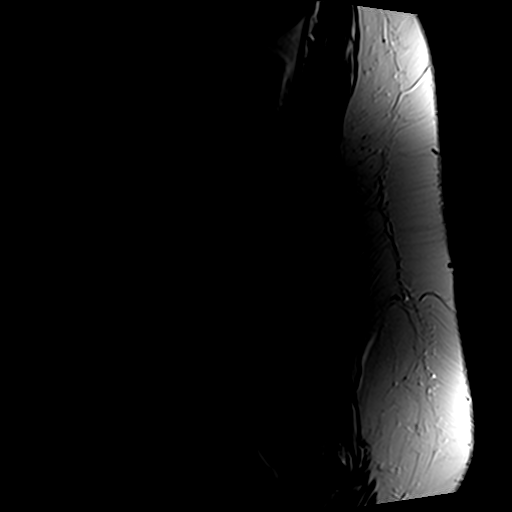

[Series 5: T1 · sagittal · 4.0mm · 0.55mm/px · 6 of 15 slices shown (1 of 2)]
[im 1/15]
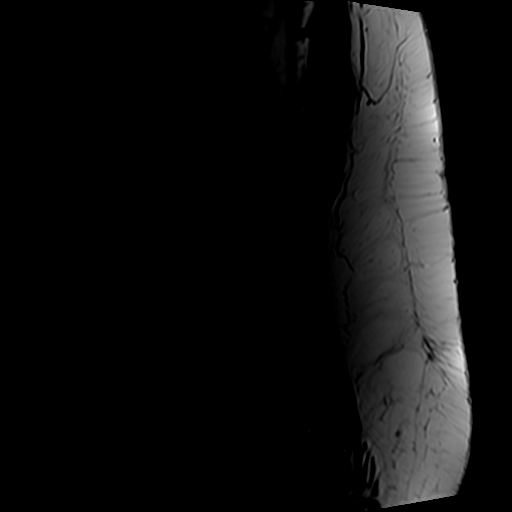
[im 3/15]
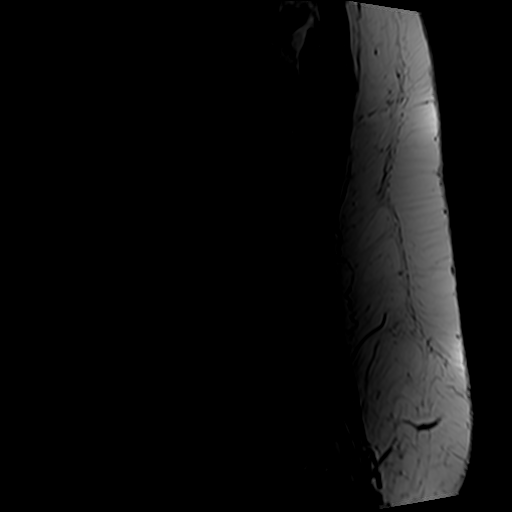
[im 6/15]
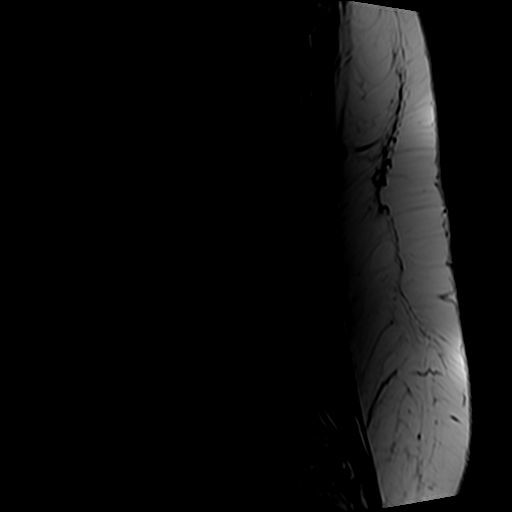
[im 9/15]
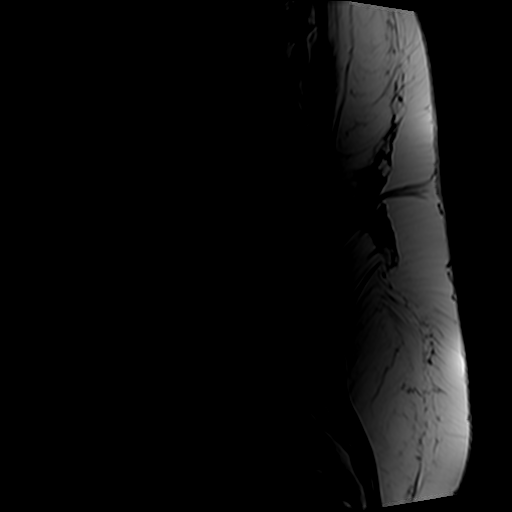
[im 12/15]
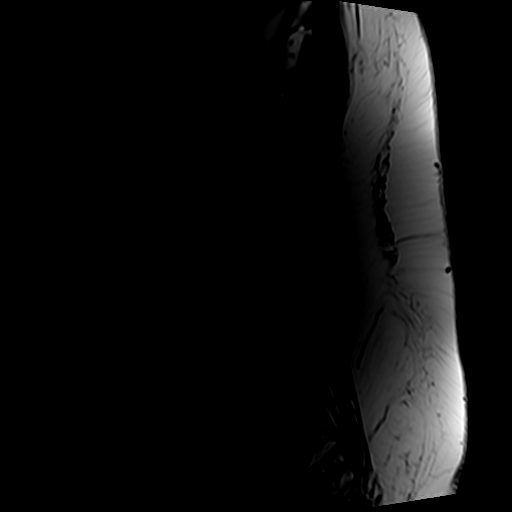
[im 15/15]
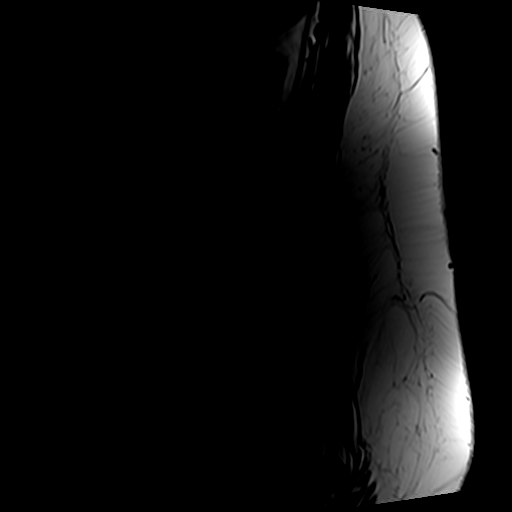

[Series 7: T2 · axial · 4.0mm · 0.70mm/px · z∈[-24,+170]mm · 9 of 35 slices shown (2 of 2)]
[im 1/35]
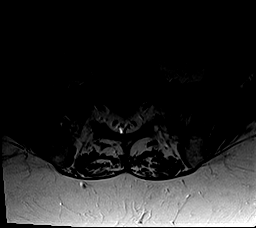
[im 5/35]
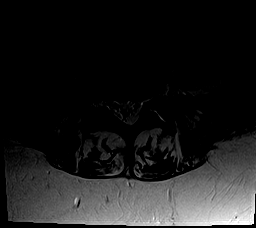
[im 10/35]
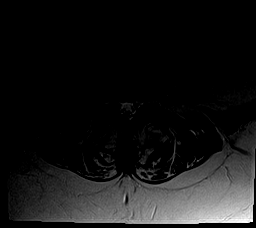
[im 15/35]
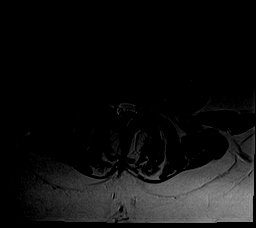
[im 18/35]
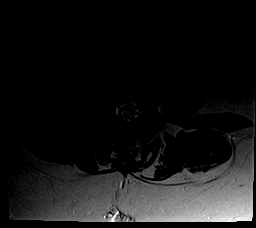
[im 20/35]
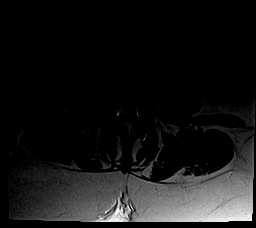
[im 25/35]
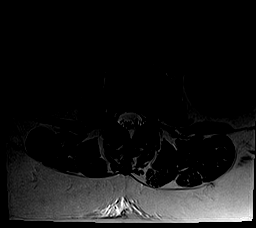
[im 30/35]
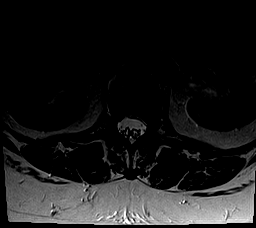
[im 35/35]
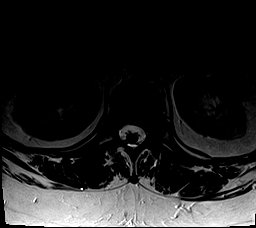

[Series 8: T1 · axial · 4.0mm · 0.35mm/px · z∈[-3,+146]mm · 3 of 35 slices shown (2 of 2)]
[im 5/35]
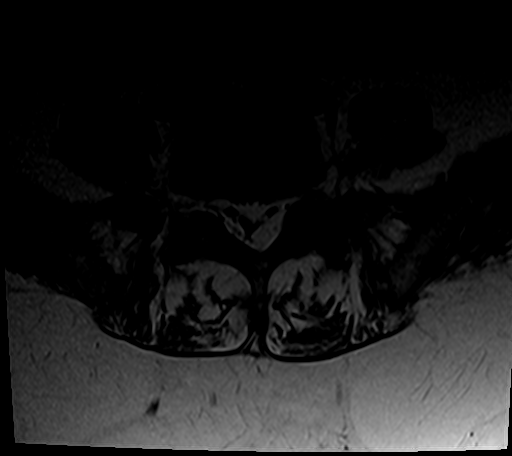
[im 18/35]
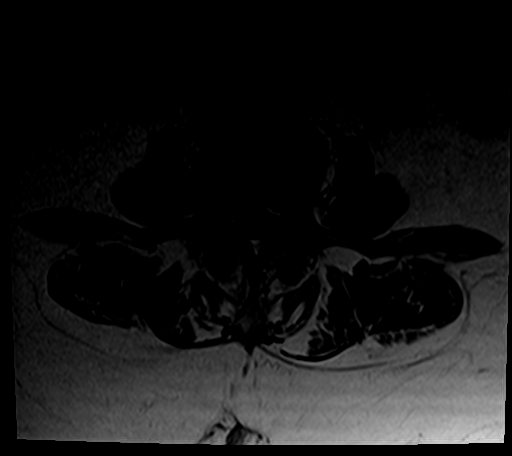
[im 30/35]
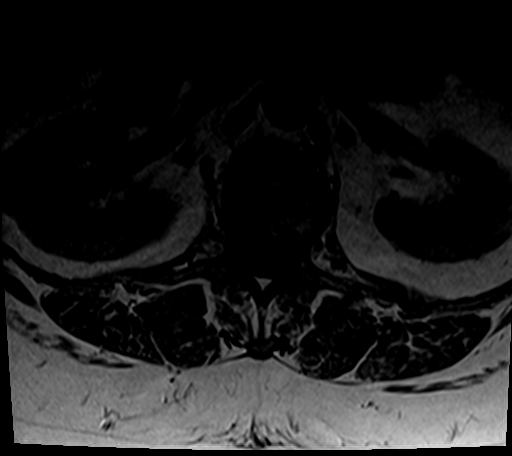

[24 of 48 positions shown; findings below may reference images not displayed]

FINDINGS: Segmentation: Standard. Lowest well-formed disc labeled the L5-S1
level.

Alignment: 4 mm anterolisthesis of L4 on L5 and L5 on S1. Underlying
mild levoscoliosis. Alignment otherwise normal with preservation of
the normal lumbar lordosis.

Vertebrae: Vertebral body heights maintained without evidence for
acute or chronic fracture. Bone marrow signal intensity within
normal limits. Few scattered benign hemangiomas noted. No worrisome
osseous lesions. Reactive endplate changes noted about the L1-2
interspace. No other abnormal marrow edema.

Conus medullaris and cauda equina: Conus extends to the L1 level.
Conus and cauda equina appear normal.

Paraspinal and other soft tissues: Paraspinous soft tissues within
normal limits. Visualized visceral structures unremarkable.

Disc levels:

T10-11: Seen only on sagittal projection. Mild diffuse disc bulge
with disc desiccation. Bilateral facet hypertrophy. No significant
stenosis.

T11-12: Seen only on sagittal projection. Mild diffuse disc bulge.
No significant stenosis.

T12-L1: Mild left eccentric disc bulge. Superimposed shallow left
foraminal disc protrusion noted (series 8, image 3). No significant
canal or foraminal stenosis.

L1-2: Mild diffuse disc bulge with disc desiccation with chronic
reactive endplate changes. Mild facet and ligament flavum
hypertrophy. No significant canal or lateral recess stenosis. Mild
bilateral L1 foraminal narrowing. No impingement.

L2-3: Mild disc bulge. Mild to moderate facet and ligament flavum
hypertrophy, slightly worse on the left. No significant spinal
stenosis. Mild left greater than right L2 foraminal narrowing. No
frank impingement.

L3-4: Mild diffuse disc bulge. Left foraminal/extraforaminal annular
fissure noted. Mild facet and ligament flavum hypertrophy. No
significant spinal stenosis. Mild bilateral L3 foraminal narrowing.
No frank impingement.

L4-5: Anterolisthesis. Mild diffuse disc bulge with disc
desiccation. Moderate facet and ligament flavum hypertrophy,
slightly worse on the right. Epidural lipomatosis. Resultant mild to
moderate bilateral lateral recess narrowing, right worse than left.
Foramina remain patent.

L5-S1: Anterolisthesis. Mild diffuse disc bulge with disc
desiccation. Moderate left with mild right facet hypertrophy.
Associated small joint effusions. Epidural lipomatosis compresses
the distal thecal sac. No other significant spinal stenosis.
Moderate left L5 foraminal narrowing.
IMPRESSION: 1. 4 mm anterolisthesis of L4 on L5 with associated disc bulge and
facet arthropathy, resulting in mild to moderate right worse than
left lateral recess stenosis.
2. Chronic 4 mm facet mediated anterolisthesis of L5 on S1 with
resultant moderate left L5 foraminal stenosis.
3. Disc bulging with facet hypertrophy at L1-2 through L3-4 with
resultant mild bilateral L1 through L3 foraminal narrowing. No frank
impingement.

## 2023-08-18 ENCOUNTER — Encounter (HOSPITAL_COMMUNITY): Payer: Self-pay

## 2023-08-18 ENCOUNTER — Observation Stay (HOSPITAL_COMMUNITY)
Admission: EM | Admit: 2023-08-18 | Discharge: 2023-08-19 | Disposition: A | Discharge status: Home / Self Care | Attending: Internal Medicine | Admitting: Internal Medicine

## 2023-08-18 ENCOUNTER — Emergency Department (HOSPITAL_COMMUNITY)

## 2023-08-18 DIAGNOSIS — I50810 Right heart failure, unspecified: Secondary | ICD-10-CM | POA: Insufficient documentation

## 2023-08-18 DIAGNOSIS — I2721 Secondary pulmonary arterial hypertension: Secondary | ICD-10-CM | POA: Insufficient documentation

## 2023-08-18 DIAGNOSIS — D649 Anemia, unspecified: Secondary | ICD-10-CM | POA: Insufficient documentation

## 2023-08-18 DIAGNOSIS — G2581 Restless legs syndrome: Secondary | ICD-10-CM | POA: Diagnosis not present

## 2023-08-18 DIAGNOSIS — J9611 Chronic respiratory failure with hypoxia: Secondary | ICD-10-CM | POA: Insufficient documentation

## 2023-08-18 DIAGNOSIS — G4733 Obstructive sleep apnea (adult) (pediatric): Secondary | ICD-10-CM | POA: Diagnosis not present

## 2023-08-18 DIAGNOSIS — Z79899 Other long term (current) drug therapy: Secondary | ICD-10-CM | POA: Diagnosis not present

## 2023-08-18 DIAGNOSIS — E876 Hypokalemia: Secondary | ICD-10-CM | POA: Insufficient documentation

## 2023-08-18 DIAGNOSIS — F17200 Nicotine dependence, unspecified, uncomplicated: Secondary | ICD-10-CM | POA: Insufficient documentation

## 2023-08-18 DIAGNOSIS — R55 Syncope and collapse: Secondary | ICD-10-CM | POA: Diagnosis present

## 2023-08-18 DIAGNOSIS — F1090 Alcohol use, unspecified, uncomplicated: Secondary | ICD-10-CM | POA: Insufficient documentation

## 2023-08-18 DIAGNOSIS — K922 Gastrointestinal hemorrhage, unspecified: Secondary | ICD-10-CM | POA: Insufficient documentation

## 2023-08-18 DIAGNOSIS — F32A Depression, unspecified: Secondary | ICD-10-CM | POA: Diagnosis not present

## 2023-08-18 DIAGNOSIS — K219 Gastro-esophageal reflux disease without esophagitis: Secondary | ICD-10-CM | POA: Diagnosis not present

## 2023-08-18 DIAGNOSIS — I951 Orthostatic hypotension: Secondary | ICD-10-CM

## 2023-08-18 LAB — CBC WITH DIFFERENTIAL/PLATELET
Abs Immature Granulocytes: 0.04 10*3/uL (ref 0.00–0.07)
Basophils Absolute: 0.1 10*3/uL (ref 0.0–0.1)
Basophils Relative: 1 %
Eosinophils Absolute: 0.1 10*3/uL (ref 0.0–0.5)
Eosinophils Relative: 2 %
HCT: 31.8 % — ABNORMAL LOW (ref 36.0–46.0)
Hemoglobin: 9.1 g/dL — ABNORMAL LOW (ref 12.0–15.0)
Immature Granulocytes: 1 %
Lymphocytes Relative: 20 %
Lymphs Abs: 1.8 10*3/uL (ref 0.7–4.0)
MCH: 26.5 pg (ref 26.0–34.0)
MCHC: 28.6 g/dL — ABNORMAL LOW (ref 30.0–36.0)
MCV: 92.7 fL (ref 80.0–100.0)
Monocytes Absolute: 0.5 10*3/uL (ref 0.1–1.0)
Monocytes Relative: 5 %
Neutro Abs: 6.4 10*3/uL (ref 1.7–7.7)
Neutrophils Relative %: 71 %
Platelets: 168 10*3/uL (ref 150–400)
RBC: 3.43 MIL/uL — ABNORMAL LOW (ref 3.87–5.11)
RDW: 15.3 % (ref 11.5–15.5)
WBC: 8.9 10*3/uL (ref 4.0–10.5)
nRBC: 0 % (ref 0.0–0.2)

## 2023-08-18 LAB — I-STAT CG4 LACTIC ACID, ED: Lactic Acid, Venous: 2.2 mmol/L (ref 0.5–1.9)

## 2023-08-18 LAB — COMPREHENSIVE METABOLIC PANEL WITH GFR
ALT: <5 U/L (ref 0–44)
AST: 12 U/L — ABNORMAL LOW (ref 15–41)
Albumin: 2 g/dL — ABNORMAL LOW (ref 3.5–5.0)
Alkaline Phosphatase: 52 U/L (ref 38–126)
Anion gap: 2 — ABNORMAL LOW (ref 5–15)
BUN: 9 mg/dL (ref 8–23)
CO2: 19 mmol/L — ABNORMAL LOW (ref 22–32)
Calcium: 5.6 mg/dL — CL (ref 8.9–10.3)
Chloride: 120 mmol/L — ABNORMAL HIGH (ref 98–111)
Creatinine, Ser: 0.43 mg/dL — ABNORMAL LOW (ref 0.44–1.00)
GFR, Estimated: >60 mL/min (ref >60)
Glucose, Bld: 75 mg/dL (ref 70–99)
Potassium: 2.7 mmol/L — CL (ref 3.5–5.1)
Sodium: 141 mmol/L (ref 135–145)
Total Bilirubin: 0.3 mg/dL (ref 0.0–1.2)
Total Protein: 4.7 g/dL — ABNORMAL LOW (ref 6.5–8.1)

## 2023-08-18 LAB — MAGNESIUM: Magnesium: 1.5 mg/dL — ABNORMAL LOW (ref 1.7–2.4)

## 2023-08-18 LAB — TROPONIN I (HIGH SENSITIVITY)
Troponin I (High Sensitivity): 6 ng/L (ref <18)
Troponin I (High Sensitivity): 9 ng/L (ref <18)

## 2023-08-18 LAB — CBG MONITORING, ED: Glucose-Capillary: 103 mg/dL — ABNORMAL HIGH (ref 70–99)

## 2023-08-18 LAB — POC OCCULT BLOOD, ED: Fecal Occult Bld: POSITIVE — AB

## 2023-08-18 LAB — PHOSPHORUS: Phosphorus: 2.4 mg/dL — ABNORMAL LOW (ref 2.5–4.6)

## 2023-08-18 MED ORDER — LACTATED RINGERS IV BOLUS
1000.0000 mL | Freq: Once | INTRAVENOUS | Status: AC
  Administered 2023-08-18: 1000 mL via INTRAVENOUS

## 2023-08-18 MED ORDER — POTASSIUM CHLORIDE 20 MEQ PO PACK
40.0000 meq | PACK | Freq: Once | ORAL | Status: AC
  Administered 2023-08-18: 40 meq via ORAL
  Filled 2023-08-18: qty 2

## 2023-08-18 MED ORDER — ENOXAPARIN SODIUM 60 MG/0.6ML IJ SOSY
60.0000 mg | PREFILLED_SYRINGE | INTRAMUSCULAR | Status: DC
  Filled 2023-08-18: qty 0.6

## 2023-08-18 MED ORDER — PANTOPRAZOLE SODIUM 40 MG IV SOLR
40.0000 mg | Freq: Two times a day (BID) | INTRAVENOUS | Status: DC
Start: 2023-08-19 — End: 2023-08-19
  Administered 2023-08-19 (×2): 40 mg via INTRAVENOUS
  Filled 2023-08-18 (×2): qty 10

## 2023-08-18 MED ORDER — CARBIDOPA-LEVODOPA 25-100 MG PO TABS: 3.0000 | ORAL_TABLET | Freq: Every day | ORAL | Status: DC

## 2023-08-18 MED ORDER — CARBIDOPA-LEVODOPA 25-100 MG PO TABS: 1.0000 | ORAL_TABLET | ORAL | Status: DC

## 2023-08-18 MED ORDER — ALBUTEROL SULFATE HFA 108 (90 BASE) MCG/ACT IN AERS: 2.0000 | INHALATION_SPRAY | Freq: Four times a day (QID) | RESPIRATORY_TRACT | Status: DC | PRN

## 2023-08-18 MED ORDER — DULOXETINE HCL 60 MG PO CPEP: 60.0000 mg | ORAL_CAPSULE | ORAL | Status: DC

## 2023-08-18 MED ORDER — CARBIDOPA-LEVODOPA 25-100 MG PO TABS
1.0000 | ORAL_TABLET | Freq: Two times a day (BID) | ORAL | Status: DC
  Administered 2023-08-19 (×2): 1 via ORAL
  Filled 2023-08-18 (×2): qty 1

## 2023-08-18 MED ORDER — VITAMIN D 25 MCG (1000 UNIT) PO TABS
1000.0000 [IU] | ORAL_TABLET | ORAL | Status: DC
  Administered 2023-08-19: 1000 [IU] via ORAL
  Filled 2023-08-18: qty 1

## 2023-08-18 MED ORDER — DULOXETINE HCL 30 MG PO CPEP: 30.0000 mg | ORAL_CAPSULE | ORAL | Status: DC

## 2023-08-18 MED ORDER — VITAMIN C 500 MG PO TABS
1000.0000 mg | ORAL_TABLET | ORAL | Status: DC
  Administered 2023-08-19: 1000 mg via ORAL
  Filled 2023-08-18: qty 2

## 2023-08-18 MED ORDER — DULOXETINE HCL 60 MG PO CPEP
90.0000 mg | ORAL_CAPSULE | Freq: Every day | ORAL | Status: DC
  Administered 2023-08-19: 90 mg via ORAL
  Filled 2023-08-18: qty 1

## 2023-08-18 MED ORDER — ALBUTEROL SULFATE (2.5 MG/3ML) 0.083% IN NEBU: 2.5000 mg | INHALATION_SOLUTION | Freq: Four times a day (QID) | RESPIRATORY_TRACT | Status: DC | PRN

## 2023-08-18 MED ORDER — POTASSIUM CHLORIDE 10 MEQ/100ML IV SOLN
10.0000 meq | Freq: Once | INTRAVENOUS | Status: AC
  Administered 2023-08-18: 10 meq via INTRAVENOUS
  Filled 2023-08-18: qty 100

## 2023-08-18 MED ORDER — MAGNESIUM SULFATE 2 GM/50ML IV SOLN
2.0000 g | Freq: Once | INTRAVENOUS | Status: AC
  Administered 2023-08-18: 2 g via INTRAVENOUS
  Filled 2023-08-18: qty 50

## 2023-08-18 MED ORDER — CALCIUM GLUCONATE-NACL 1-0.675 GM/50ML-% IV SOLN
1.0000 g | Freq: Once | INTRAVENOUS | Status: AC
  Administered 2023-08-18: 1000 mg via INTRAVENOUS
  Filled 2023-08-18: qty 50

## 2023-08-18 NOTE — ED Triage Notes (Signed)
 Pt comes via [facility] for syncope and hypotension. Was sitting on the toilet and fell over, did not hit head, initial pressure was  80/40, 500cc of fluid given with EMS and 4mg  zofran

## 2023-08-18 NOTE — ED Notes (Signed)
 ED TO INPATIENT HANDOFF REPORT  ED Nurse Name and Phone #: Jacqulyn Liner EMTP [phone]  S Name/Age/Gender Josefine Class 73 y.o. female Room/Bed: WA22/WA22  Code Status   Code Status: Prior  Home/SNF/Other Home Patient oriented to: self, place, time, and situation Is this baseline? Yes   Triage Complete: Triage complete  Chief Complaint Syncope [R55]  Triage Note Pt comes via [facility] for syncope and hypotension. Was sitting on the toilet and fell over, did not hit head, initial pressure was  80/40, 500cc of fluid given with EMS and 4mg  zofran    Allergies Allergies  Allergen Reactions   Codeine Other (See Comments)    hallucinations   Erythromycin Nausea And Vomiting and Rash    Level of Care/Admitting Diagnosis ED Disposition     ED Disposition  Admit   Condition  --   Comment  Hospital Area: [facility] [[id number]]  Level of Care: Telemetry [5]  Admit to tele based on following criteria: Eval of Syncope  May place patient in observation at [facility] or [facility] if equivalent level of care is available:: Yes  Covid Evaluation: Asymptomatic - no recent exposure (last 10 days) testing not required  Diagnosis: Syncope [206001]  Admitting Physician: Eduard Clos [phone]  Attending Physician: Eduard Clos Florian.Pax          B Medical/Surgery History Past Medical History:  Diagnosis Date   Fibromyalgia    GERD (gastroesophageal reflux disease)    Morbid obesity (HCC)    OSA (obstructive sleep apnea)    Restless leg syndrome    Past Surgical History:  Procedure Laterality Date   CESAREAN SECTION     ENTEROCELE REPAIR     HERNIA REPAIR     Mid-urethral sling procedure     TOTAL ABDOMINAL HYSTERECTOMY W/ BILATERAL SALPINGOOPHORECTOMY       A IV Location/Drains/Wounds Patient Lines/Drains/Airways Status     Active Line/Drains/Airways     Name Placement date Placement time Site Days   Peripheral IV 08/18/23 20 G Left  Wrist 08/18/23  1933  Wrist  less than 1            Intake/Output Last 24 hours No intake or output data in the 24 hours ending 08/18/23 2245  Labs/Imaging Results for orders placed or performed during the hospital encounter of 08/18/23 (from the past 48 hours)  CBG monitoring, ED     Status: Abnormal   Collection Time: 08/18/23  7:52 PM  Result Value Ref Range   Glucose-Capillary 103 (H) 70 - 99 mg/dL    Comment: Glucose reference range applies only to samples taken after fasting for at least 8 hours.   Comment 1 Notify RN   CBC WITH DIFFERENTIAL     Status: Abnormal   Collection Time: 08/18/23  8:07 PM  Result Value Ref Range   WBC 8.9 4.0 - 10.5 K/uL   RBC 3.43 (L) 3.87 - 5.11 MIL/uL   Hemoglobin 9.1 (L) 12.0 - 15.0 g/dL   HCT 98.1 (L) 19.1 - 47.8 %   MCV 92.7 80.0 - 100.0 fL   MCH 26.5 26.0 - 34.0 pg   MCHC 28.6 (L) 30.0 - 36.0 g/dL   RDW 29.5 62.1 - 30.8 %   Platelets 168 150 - 400 K/uL   nRBC 0.0 0.0 - 0.2 %   Neutrophils Relative % 71 %   Neutro Abs 6.4 1.7 - 7.7 K/uL   Lymphocytes Relative 20 %   Lymphs  Abs 1.8 0.7 - 4.0 K/uL   Monocytes Relative 5 %   Monocytes Absolute 0.5 0.1 - 1.0 K/uL   Eosinophils Relative 2 %   Eosinophils Absolute 0.1 0.0 - 0.5 K/uL   Basophils Relative 1 %   Basophils Absolute 0.1 0.0 - 0.1 K/uL   Immature Granulocytes 1 %   Abs Immature Granulocytes 0.04 0.00 - 0.07 K/uL    Comment: Performed at [facility], [address], [place], [place] [address]  Comprehensive metabolic panel     Status: Abnormal   Collection Time: 08/18/23  8:07 PM  Result Value Ref Range   Sodium 141 135 - 145 mmol/L   Potassium 2.7 (LL) 3.5 - 5.1 mmol/L    Comment: CRITICAL RESULT CALLED TO, READ BACK BY AND VERIFIED WITH C. Roshini Fulwider, RN 2114 08/18/23 BY Jonnie Finner    Chloride 120 (H) 98 - 111 mmol/L   CO2 19 (L) 22 - 32 mmol/L   Glucose, Bld 75 70 - 99 mg/dL    Comment: Glucose reference range applies only to samples taken after fasting  for at least 8 hours.   BUN 9 8 - 23 mg/dL   Creatinine, Ser 6.04 (L) 0.44 - 1.00 mg/dL   Calcium 5.6 (LL) 8.9 - 10.3 mg/dL    Comment: CRITICAL RESULT CALLED TO, READ BACK BY AND VERIFIED WITH C. Kyion Gautier, RN 2114 08/18/23 BY Jonnie Finner    Total Protein 4.7 (L) 6.5 - 8.1 g/dL   Albumin 2.0 (L) 3.5 - 5.0 g/dL   AST 12 (L) 15 - 41 U/L   ALT <5 0 - 44 U/L   Alkaline Phosphatase 52 38 - 126 U/L   Total Bilirubin 0.3 0.0 - 1.2 mg/dL   GFR, Estimated >54 >09 mL/min    Comment: (NOTE) Calculated using the CKD-EPI Creatinine Equation (2021)    Anion gap 2 (L) 5 - 15    Comment: Performed at [facility], [address], [place], [place] [address]  Troponin I (High Sensitivity)     Status: None   Collection Time: 08/18/23  8:07 PM  Result Value Ref Range   Troponin I (High Sensitivity) 6 <18 ng/L    Comment: (NOTE) Elevated high sensitivity troponin I (hsTnI) values and significant  changes across serial measurements may suggest ACS but many other  chronic and acute conditions are known to elevate hsTnI results.  Refer to the "Links" section for chest pain algorithms and additional  guidance. Performed at [facility], [address] [place], [place] [address]   Magnesium     Status: Abnormal   Collection Time: 08/18/23  8:07 PM  Result Value Ref Range   Magnesium 1.5 (L) 1.7 - 2.4 mg/dL    Comment: Performed at [facility], [address] [place], [place] [address]  Phosphorus     Status: Abnormal   Collection Time: 08/18/23  8:07 PM  Result Value Ref Range   Phosphorus 2.4 (L) 2.5 - 4.6 mg/dL    Comment: Performed at [facility], [address] [place], [place] [address]  POC occult blood, ED Provider will collect     Status: Abnormal   Collection Time: 08/18/23  8:27 PM  Result Value Ref Range   Fecal Occult Bld POSITIVE (A) NEGATIVE  Troponin I (High Sensitivity)     Status: None   Collection Time:  08/18/23  9:40 PM  Result Value Ref Range   Troponin I (High  Sensitivity) 9 <18 ng/L    Comment: (NOTE) Elevated high sensitivity troponin I (hsTnI) values and significant  changes across serial measurements may suggest ACS but many other  chronic and acute conditions are known to elevate hsTnI results.  Refer to the "Links" section for chest pain algorithms and additional  guidance. Performed at [facility], [address], [place], [place] [address]   I-Stat CG4 Lactic Acid     Status: Abnormal   Collection Time: 08/18/23  9:41 PM  Result Value Ref Range   Lactic Acid, Venous 2.2 (HH) 0.5 - 1.9 mmol/L   Comment NOTIFIED PHYSICIAN    DG Chest Portable 1 View Result Date: 08/18/2023 CLINICAL DATA:  Syncope.  Low blood pressure. EXAM: PORTABLE CHEST 1 VIEW COMPARISON:  11/09/2021 FINDINGS: Stable cardiomediastinal silhouette. Aortic atherosclerotic calcification. Increased bilateral hazy airspace and interstitial opacities. These have increased in the left lung compared with 11/09/2021. No large pleural effusion. No pneumothorax. No displaced rib fractures. IMPRESSION: Bilateral hazy airspace and interstitial opacities consistent with chronic lung disease. These have increased in the left lung and are suspicious for superimposed edema or infection. Electronically Signed   By: Minerva Fester M.D.   On: 08/18/2023 21:12   CT Head Wo Contrast Result Date: 08/18/2023 CLINICAL DATA:  Syncope/presyncope, cerebrovascular cause suspected. Syncope, hypotension EXAM: CT HEAD WITHOUT CONTRAST TECHNIQUE: Contiguous axial images were obtained from the base of the skull through the vertex without intravenous contrast. RADIATION DOSE REDUCTION: This exam was performed according to the departmental dose-optimization program which includes automated exposure control, adjustment of the mA and/or kV according to patient size and/or use of iterative reconstruction technique. COMPARISON:   09/02/2015 FINDINGS: Brain: Normal anatomic configuration. Parenchymal volume loss is commensurate with the patient's age. Mild periventricular white matter changes are present likely reflecting the sequela of small vessel ischemia. No abnormal intra or extra-axial mass lesion or fluid collection. No abnormal mass effect or midline shift. No evidence of acute intracranial hemorrhage or infarct. Ventricular size is normal. Cerebellum unremarkable. Vascular: No asymmetric hyperdense vasculature at the skull base. Skull: Intact Sinuses/Orbits: Paranasal sinuses are clear. Orbits are unremarkable. Other: Mastoid air cells and middle ear cavities are clear. IMPRESSION: 1. No acute intracranial hemorrhage or infarct. 2. Mild senescent change. Electronically Signed   By: Helyn Numbers M.D.   On: 08/18/2023 20:47    Pending Labs Unresulted Labs (From admission, onward)     Start     Ordered   08/18/23 2142  Calcium, ionized  Add-on,   AD        08/18/23 2141   08/18/23 2128  Calcium, ionized  Add-on,   AD        08/18/23 2127            Vitals/Pain Today's Vitals   08/18/23 1936 08/18/23 2200 08/18/23 2230  BP: 123/80    Pulse: 100 84 90  Resp: 17 13 19   Temp: 97.8 F (36.6 C)    TempSrc: Oral    SpO2: 92% 100% 99%    Isolation Precautions No active isolations  Medications Medications  magnesium sulfate IVPB 2 g 50 mL (2 g Intravenous New Bag/Given 08/18/23 2242)  lactated ringers bolus 1,000 mL (1,000 mLs Intravenous New Bag/Given 08/18/23 2136)  potassium chloride 10 mEq in 100 mL IVPB (0 mEq Intravenous Stopped 08/18/23 2242)  potassium chloride (KLOR-CON) packet 40 mEq (40 mEq Oral Given 08/18/23 2138)  calcium gluconate 1 g/ 50 mL sodium chloride IVPB (0 mg  Intravenous Stopped 08/18/23 2236)  potassium chloride (KLOR-CON) packet 40 mEq (40 mEq Oral Given 08/18/23 2242)    Mobility walks     Focused Assessments Patient came in after episode of passing out while in the bathroom.  Patient is A&Ox4. Patient has not had any complaints while being in my care. Patient did have blood in stool on occult testing. Denies diarrhea or any gastro problems. Patient did state she doesn't eat a lot on a normal bases. Patient is currently getting potassium replenishment.    R Recommendations: See Admitting Provider Note  Report given to:   Additional Notes:

## 2023-08-18 NOTE — Discharge Instructions (Addendum)
 Follow with Spry, Geroge Baseman., MD in 5-7 days  Please get a complete blood count and chemistry panel checked by your Primary MD at your next visit, and again as instructed by your Primary MD. Please get your medications reviewed and adjusted by your Primary MD.  Please request your Primary MD to go over all Hospital Tests and Procedure/Radiological results at the follow up, please get all Hospital records sent to your Prim MD by signing hospital release before you go home.  In some cases, there will be blood work, cultures and biopsy results pending at the time of your discharge. Please request that your primary care M.D. goes through all the records of your hospital data and follows up on these results.  If you had Pneumonia of Lung problems at the Hospital: Please get a 2 view Chest X ray done in 6-8 weeks after hospital discharge or sooner if instructed by your Primary MD.  If you have Congestive Heart Failure: Please call your Cardiologist or Primary MD anytime you have any of the following symptoms:  1) 3 pound weight gain in 24 hours or 5 pounds in 1 week  2) shortness of breath, with or without a dry hacking cough  3) swelling in the hands, feet or stomach  4) if you have to sleep on extra pillows at night in order to breathe  Follow cardiac low salt diet and 1.5 lit/day fluid restriction.  If you have diabetes Accuchecks 4 times/day, Once in AM empty stomach and then before each meal. Log in all results and show them to your primary doctor at your next visit. If any glucose reading is under 80 or above 300 call your primary MD immediately.  If you have Seizure/Convulsions/Epilepsy: Please do not drive, operate heavy machinery, participate in activities at heights or participate in high speed sports until you have seen by Primary MD or a Neurologist and advised to do so again. Per [facility] statutes, patients with seizures are not allowed to drive until they have been  seizure-free for six months.  Use caution when using heavy equipment or power tools. Avoid working on ladders or at heights. Take showers instead of baths. Ensure the water temperature is not too high on the home water heater. Do not go swimming alone. Do not lock yourself in a room alone (i.e. bathroom). When caring for infants or small children, sit down when holding, feeding, or changing them to minimize risk of injury to the child in the event you have a seizure. Maintain good sleep hygiene. Avoid alcohol.   If you had Gastrointestinal Bleeding: Please ask your Primary MD to check a complete blood count within one week of discharge or at your next visit. Your endoscopic/colonoscopic biopsies that are pending at the time of discharge, will also need to followed by your Primary MD.  Get Medicines reviewed and adjusted. Please take all your medications with you for your next visit with your Primary MD  Please request your Primary MD to go over all hospital tests and procedure/radiological results at the follow up, please ask your Primary MD to get all Hospital records sent to his/her office.  If you experience worsening of your admission symptoms, develop shortness of breath, life threatening emergency, suicidal or homicidal thoughts you must seek medical attention immediately by calling 911 or calling your MD immediately  if symptoms less severe.  You must read complete instructions/literature along with all the possible adverse reactions/side effects for all the Medicines you  take and that have been prescribed to you. Take any new Medicines after you have completely understood and accpet all the possible adverse reactions/side effects.   Do not drive or operate heavy machinery when taking Pain medications.   Do not take more than prescribed Pain, Sleep and Anxiety Medications  Special Instructions: If you have smoked or chewed Tobacco  in the last 2 yrs please stop smoking, stop any regular  Alcohol  and or any Recreational drug use.  Wear Seat belts while driving.  Please note You were cared for by a hospitalist during your hospital stay. If you have any questions about your discharge medications or the care you received while you were in the hospital after you are discharged, you can call the unit and asked to speak with the hospitalist on call if the hospitalist that took care of you is not available. Once you are discharged, your primary care physician will handle any further medical issues. Please note that NO REFILLS for any discharge medications will be authorized once you are discharged, as it is imperative that you return to your primary care physician (or establish a relationship with a primary care physician if you do not have one) for your aftercare needs so that they can reassess your need for medications and monitor your lab values.  You can reach the hospitalist office at phone [phone] or fax [phone]   If you do not have a primary care physician, you can call [phone] for a physician referral.  Activity: As tolerated with Full fall precautions use walker/cane & assistance as needed    Diet: regular  Disposition Home

## 2023-08-18 NOTE — H&P (Signed)
 History and Physical    Kathy Lawrence RUE:[id number] DOB: [birth year] DOA: 08/18/2023  Patient coming from: Home.  Chief Complaint: Loss of consciousness.  HPI: Kathy Lawrence is a 73 y.o. female with history of restless leg syndrome, depression, fibromyalgia, sleep apnea was brought to the ER after patient lost consciousness while on the commode.  Patient states she was straining on the commode when she lost consciousness.  She may have lost consciousness for about less than 2 minutes.  Her husband and her family members brought her to the hospital.  Denies any chest pain shortness of breath palpitation before the episode or after episode.  Has not noticed any black stools or blood in the stools.  ED Course: In the ER EKG shows normal sinus rhythm.  CT head is unremarkable.  Labs show potassium of 2.7 magnesium of 1.5.  Hemoglobin 9.1 which is a drop from 11 on 06/23/2021 in Care Everywhere.  Stool for occult blood is positive.  Patient admitted for further management of syncope and anemia with GI bleed.  Review of Systems: As per HPI, rest all negative.   Past Medical History:  Diagnosis Date   Fibromyalgia    GERD (gastroesophageal reflux disease)    Morbid obesity (HCC)    OSA (obstructive sleep apnea)    Restless leg syndrome     Past Surgical History:  Procedure Laterality Date   CESAREAN SECTION     ENTEROCELE REPAIR     HERNIA REPAIR     Mid-urethral sling procedure     TOTAL ABDOMINAL HYSTERECTOMY W/ BILATERAL SALPINGOOPHORECTOMY       reports that she has been smoking. She does not have any smokeless tobacco history on file. She reports current alcohol use. She reports that she does not use drugs.  Allergies  Allergen Reactions   Codeine Other (See Comments)    hallucinations   Erythromycin Nausea And Vomiting and Rash    Family History  Problem Relation Age of Onset   Cancer Mother    Cancer Father    Breast cancer Other    Stroke Paternal Grandfather      Prior to Admission medications   Medication Sig Start Date End Date Taking? Authorizing Provider  albuterol (VENTOLIN HFA) 108 (90 Base) MCG/ACT inhaler Inhale 2 puffs into the lungs every 6 (six) hours as needed for wheezing or shortness of breath.   Yes [provider]  Ascorbic Acid (VITAMIN C) 1000 MG tablet Take 1,000 mg by mouth once a week.   Yes [provider]  carbidopa-levodopa (SINEMET IR) 25-100 MG tablet Take 1 tablet by mouth See admin instructions. Takes 1 tab in am and 1 tab in noon and 3 tabs in evening 08/28/15  Yes [provider]  Cholecalciferol (VITAMIN D-1000 MAX ST) 1000 units tablet Take 1,000 Units by mouth once a week.   Yes [provider]  DULoxetine (CYMBALTA) 30 MG capsule Take 30 mg by mouth as directed. Take once daily along with 60 mg=90 mg   Yes [provider]  DULoxetine (CYMBALTA) 60 MG capsule Take 60 mg by mouth as directed. Take once daily along with 30 mg=90 mg   Yes [provider]  Homeopathic Products (LEG CRAMP RELIEF) TABS Take 1 tablet by mouth daily.   Yes [provider]  ibuprofen (ADVIL,MOTRIN) 200 MG tablet Take 400 mg by mouth every 6 (six) hours as needed for headache or moderate pain.   Yes [provider]    Physical Exam:  Constitutional: Moderately built and nourished. Vitals:   08/18/23 1936 08/18/23 2200 08/18/23 2230 08/18/23 2308  BP: 123/80   (!) 124/52  Pulse: 100 84 90 80  Resp: 17 13 19 15   Temp: 97.8 F (36.6 C)     TempSrc: Oral     SpO2: 92% 100% 99% 100%   Eyes: Anicteric no pallor. ENMT: No discharge from the ears eyes nose or mouth. Neck: No mass felt.  No neck rigidity. Respiratory: No rhonchi or crepitations. Cardiovascular: S1-S2 heard. Abdomen: Soft nontender bowel sound present. Musculoskeletal: No edema. Skin: No rash. Neurologic: Alert awake oriented to time place and person.  Moves all extremities. Psychiatric: Normal.  Normal  affect.   Labs on Admission: I have personally reviewed following labs and imaging studies  CBC: Recent Labs  Lab 08/18/23 2007  WBC 8.9  NEUTROABS 6.4  HGB 9.1*  HCT 31.8*  MCV 92.7  PLT 168   Basic Metabolic Panel: Recent Labs  Lab 08/18/23 2007  NA 141  K 2.7*  CL 120*  CO2 19*  GLUCOSE 75  BUN 9  CREATININE 0.43*  CALCIUM 5.6*  MG 1.5*  PHOS 2.4*   GFR: CrCl cannot be calculated (Unknown ideal weight.). Liver Function Tests: Recent Labs  Lab 08/18/23 2007  AST 12*  ALT <5  ALKPHOS 52  BILITOT 0.3  PROT 4.7*  ALBUMIN 2.0*   No results for input(s): "LIPASE", "AMYLASE" in the last 168 hours. No results for input(s): "AMMONIA" in the last 168 hours. Coagulation Profile: No results for input(s): "INR", "PROTIME" in the last 168 hours. Cardiac Enzymes: No results for input(s): "CKTOTAL", "CKMB", "CKMBINDEX", "TROPONINI" in the last 168 hours. BNP (last 3 results) No results for input(s): "PROBNP" in the last 8760 hours. HbA1C: No results for input(s): "HGBA1C" in the last 72 hours. CBG: Recent Labs  Lab 08/18/23 1952  GLUCAP 103*   Lipid Profile: No results for input(s): "CHOL", "HDL", "LDLCALC", "TRIG", "CHOLHDL", "LDLDIRECT" in the last 72 hours. Thyroid Function Tests: No results for input(s): "TSH", "T4TOTAL", "FREET4", "T3FREE", "THYROIDAB" in the last 72 hours. Anemia Panel: No results for input(s): "VITAMINB12", "FOLATE", "FERRITIN", "TIBC", "IRON", "RETICCTPCT" in the last 72 hours. Urine analysis:    Component Value Date/Time   COLORURINE YELLOW 09/02/2015 1522   APPEARANCEUR HAZY (A) 09/02/2015 1522   LABSPEC 1.014 09/02/2015 1522   PHURINE 5.5 09/02/2015 1522   GLUCOSEU NEGATIVE 09/02/2015 1522   HGBUR NEGATIVE 09/02/2015 1522   BILIRUBINUR NEGATIVE 09/02/2015 1522   KETONESUR NEGATIVE 09/02/2015 1522   PROTEINUR NEGATIVE 09/02/2015 1522   NITRITE NEGATIVE 09/02/2015 1522   LEUKOCYTESUR NEGATIVE 09/02/2015 1522   Sepsis  Labs: @LABRCNTIP (procalcitonin:4,lacticidven:4) )No results found for this or any previous visit (from the past 240 hours).   Radiological Exams on Admission: DG Chest Portable 1 View Result Date: 08/18/2023 CLINICAL DATA:  Syncope.  Low blood pressure. EXAM: PORTABLE CHEST 1 VIEW COMPARISON:  11/09/2021 FINDINGS: Stable cardiomediastinal silhouette. Aortic atherosclerotic calcification. Increased bilateral hazy airspace and interstitial opacities. These have increased in the left lung compared with 11/09/2021. No large pleural effusion. No pneumothorax. No displaced rib fractures. IMPRESSION: Bilateral hazy airspace and interstitial opacities consistent with chronic lung disease. These have increased in the left lung and are suspicious for superimposed edema or infection. Electronically Signed   By: Minerva Fester M.D.   On: 08/18/2023 21:12   CT Head Wo Contrast Result Date: 08/18/2023 CLINICAL DATA:  Syncope/presyncope, cerebrovascular cause suspected. Syncope, hypotension EXAM: CT HEAD WITHOUT CONTRAST TECHNIQUE:  Contiguous axial images were obtained from the base of the skull through the vertex without intravenous contrast. RADIATION DOSE REDUCTION: This exam was performed according to the departmental dose-optimization program which includes automated exposure control, adjustment of the mA and/or kV according to patient size and/or use of iterative reconstruction technique. COMPARISON:  09/02/2015 FINDINGS: Brain: Normal anatomic configuration. Parenchymal volume loss is commensurate with the patient's age. Mild periventricular white matter changes are present likely reflecting the sequela of small vessel ischemia. No abnormal intra or extra-axial mass lesion or fluid collection. No abnormal mass effect or midline shift. No evidence of acute intracranial hemorrhage or infarct. Ventricular size is normal. Cerebellum unremarkable. Vascular: No asymmetric hyperdense vasculature at the skull base. Skull:  Intact Sinuses/Orbits: Paranasal sinuses are clear. Orbits are unremarkable. Other: Mastoid air cells and middle ear cavities are clear. IMPRESSION: 1. No acute intracranial hemorrhage or infarct. 2. Mild senescent change. Electronically Signed   By: Helyn Numbers M.D.   On: 08/18/2023 20:47    EKG: Independently reviewed.  Normal sinus rhythm.  Assessment/Plan Principal Problem:   Syncope Active Problems:   OSA on CPAP   Restless leg syndrome   GERD (gastroesophageal reflux disease)   Anemia   GI bleeding    Syncope -   likely vasovagal related to straining while having bowel movements.  Will check orthostatics check 2D echo.  Continue to monitor in telemetry given the multiple electrolyte deficiency. Severe hypokalemia and hypomagnesemia cause not clear.  Replace and recheck. Anemia with stool for occult blood positive patient states she has had a colonoscopy within the last 5 years but I am not able to access the records.  Dr. Loreta Ave gastroenterology has been consulted.  Will check serial CBC.  Check anemia panel.  Protonix.  Patient states she does take ibuprofen off and on at least once a week. Sleep apnea on CPAP at bedtime. Restless leg syndrome patient states she takes Sinemet for that. Depression on Cymbalta.  Since patient has syncope and with anemia and GI bleed will need close monitoring and more than 2 midnight stay.   DVT prophylaxis: SCDs. Code Status: Full code. Family Communication: Discussed with patient. Disposition Plan: Monitored bed. Consults called: Gastroenterologist. Admission status: Observation.

## 2023-08-18 NOTE — ED Notes (Signed)
 Jamela RN was contacted for report.

## 2023-08-18 NOTE — ED Provider Notes (Signed)
 [facility] AT [facility] Provider Note   CSN: [id number] Arrival date & time: 08/18/23  1927     History  Chief Complaint  Patient presents with   Loss of Consciousness    Kathy Lawrence is a 73 y.o. female with pulmonary artery hypertension, interstitial lung disease on 2L nasal cannula at home, OSA, GERD, presents with concern for episode of syncope that occurred when having a bowel movement on the toilet earlier today.  States she was bearing down because she is having a very hard stool, and started to feel dizzy and nauseous.  She called her husband to the bathroom, and he caught her while she passed out.  She did not hit her head.    EMS was called and they reportedly found patient with a low blood pressure and recommended she come to the ER.  She currently feels at baseline.  Denies any chest pain, shortness of breath, dizziness, nausea or vomiting, or abdominal pain.  HPI     Home Medications Prior to Admission medications   Medication Sig Start Date End Date Taking? Authorizing Provider  Ascorbic Acid (VITAMIN C) 1000 MG tablet Take 1,000 mg by mouth daily.    [provider]  aspirin EC 81 MG EC tablet Take 1 tablet (81 mg total) by mouth daily. 09/03/15   Rolly Salter, MD  carbidopa-levodopa (SINEMET IR) 25-100 MG tablet Take 1-3 tablets by mouth See admin instructions. Takes 1 tab in am and 1 tab in noon and 3 tabs in evening 08/28/15   [provider]  Cholecalciferol (VITAMIN D-1000 MAX ST) 1000 units tablet Take 1,000 Units by mouth daily.    [provider]  Homeopathic Products (LEG CRAMP RELIEF) TABS Take 1 tablet by mouth daily.    [provider]  ibuprofen (ADVIL,MOTRIN) 200 MG tablet Take 400 mg by mouth every 6 (six) hours as needed for headache or moderate pain.    [provider]  LORazepam (ATIVAN) 1 MG tablet Take 1 mg by mouth every 8 (eight) hours as needed for anxiety or sleep.     [provider]  Multiple Vitamin (MULTIVITAMIN WITH MINERALS) TABS tablet Take 1 tablet by mouth daily.    [provider]  sertraline (ZOLOFT) 100 MG tablet Take 100 mg by mouth 2 (two) times daily. 09/01/15   [provider]  vitamin B-12 (CYANOCOBALAMIN) 1000 MCG tablet Take 1,000 mcg by mouth daily.    [provider]      Allergies    Codeine and Erythromycin    Review of Systems   Review of Systems  Respiratory:  Negative for shortness of breath.   Cardiovascular:  Negative for chest pain.  Neurological:  Positive for syncope.    Physical Exam Updated Vital Signs BP 123/80 (BP Location: Right Arm)   Pulse 100   Temp 97.8 F (36.6 C) (Oral)   Resp 17   SpO2 92%  Physical Exam Vitals and nursing note reviewed.  Constitutional:      General: She is not in acute distress.    Appearance: She is well-developed.  HENT:     Head: Normocephalic and atraumatic.  Eyes:     Extraocular Movements: Extraocular movements intact.     Conjunctiva/sclera: Conjunctivae normal.     Pupils: Pupils are equal, round, and reactive to light.  Cardiovascular:     Rate and Rhythm: Normal rate and regular rhythm.     Heart sounds: Murmur heard.  Pulmonary:     Effort: Pulmonary effort is normal. No respiratory distress.     Breath sounds: Normal breath sounds.  Abdominal:     Palpations: Abdomen is soft.     Tenderness: There is no abdominal tenderness.  Musculoskeletal:        General: No swelling.     Cervical back: Neck supple.  Skin:    General: Skin is warm and dry.     Capillary Refill: Capillary refill takes less than 2 seconds.  Neurological:     General: No focal deficit present.     Mental Status: She is alert.  Psychiatric:        Mood and Affect: Mood normal.     ED Results / Procedures / Treatments   Labs (all labs ordered are listed, but only abnormal results are displayed) Labs Reviewed  CBC WITH DIFFERENTIAL/PLATELET -  Abnormal; Notable for the following components:      Result Value   RBC 3.43 (*)    Hemoglobin 9.1 (*)    HCT 31.8 (*)    MCHC 28.6 (*)    All other components within normal limits  COMPREHENSIVE METABOLIC PANEL WITH GFR - Abnormal; Notable for the following components:   Potassium 2.7 (*)    Chloride 120 (*)    CO2 19 (*)    Creatinine, Ser 0.43 (*)    Calcium 5.6 (*)    Total Protein 4.7 (*)    Albumin 2.0 (*)    AST 12 (*)    Anion gap 2 (*)    All other components within normal limits  MAGNESIUM - Abnormal; Notable for the following components:   Magnesium 1.5 (*)    All other components within normal limits  PHOSPHORUS - Abnormal; Notable for the following components:   Phosphorus 2.4 (*)    All other components within normal limits  CBG MONITORING, ED - Abnormal; Notable for the following components:   Glucose-Capillary 103 (*)    All other components within normal limits  POC OCCULT BLOOD, ED - Abnormal; Notable for the following components:   Fecal Occult Bld POSITIVE (*)    All other components within normal limits  I-STAT CG4 LACTIC ACID, ED - Abnormal; Notable for the following components:   Lactic Acid, Venous 2.2 (*)    All other components within normal limits  CALCIUM, IONIZED  CALCIUM, IONIZED  TROPONIN I (HIGH SENSITIVITY)  TROPONIN I (HIGH SENSITIVITY)    EKG EKG Interpretation Date/Time:  Friday August 18 2023 19:38:06 EDT Ventricular Rate:  78 PR Interval:  162 QRS Duration:  85 QT Interval:  386 QTC Calculation: 440 R Axis:   107  Text Interpretation: Sinus rhythm Probable right ventricular hypertrophy Nonspecific T abnrm, anterolateral leads Confirmed by Kristine Royal [phone]) on 08/18/2023 8:28:17 PM  Radiology DG Chest Portable 1 View Result Date: 08/18/2023 CLINICAL DATA:  Syncope.  Low blood pressure. EXAM: PORTABLE CHEST 1 VIEW COMPARISON:  11/09/2021 FINDINGS: Stable cardiomediastinal silhouette. Aortic atherosclerotic calcification.  Increased bilateral hazy airspace and interstitial opacities. These have increased in the left lung compared with 11/09/2021. No large pleural effusion. No pneumothorax. No displaced rib fractures. IMPRESSION: Bilateral hazy airspace and interstitial opacities consistent with chronic lung disease. These have increased in the left lung and are suspicious for superimposed edema or infection. Electronically Signed   By: Minerva Fester M.D.   On: 08/18/2023 21:12   CT Head Wo Contrast Result Date: 08/18/2023 CLINICAL DATA:  Syncope/presyncope, cerebrovascular cause suspected. Syncope, hypotension  EXAM: CT HEAD WITHOUT CONTRAST TECHNIQUE: Contiguous axial images were obtained from the base of the skull through the vertex without intravenous contrast. RADIATION DOSE REDUCTION: This exam was performed according to the departmental dose-optimization program which includes automated exposure control, adjustment of the mA and/or kV according to patient size and/or use of iterative reconstruction technique. COMPARISON:  09/02/2015 FINDINGS: Brain: Normal anatomic configuration. Parenchymal volume loss is commensurate with the patient's age. Mild periventricular white matter changes are present likely reflecting the sequela of small vessel ischemia. No abnormal intra or extra-axial mass lesion or fluid collection. No abnormal mass effect or midline shift. No evidence of acute intracranial hemorrhage or infarct. Ventricular size is normal. Cerebellum unremarkable. Vascular: No asymmetric hyperdense vasculature at the skull base. Skull: Intact Sinuses/Orbits: Paranasal sinuses are clear. Orbits are unremarkable. Other: Mastoid air cells and middle ear cavities are clear. IMPRESSION: 1. No acute intracranial hemorrhage or infarct. 2. Mild senescent change. Electronically Signed   By: Helyn Numbers M.D.   On: 08/18/2023 20:47    Procedures .Critical Care  Performed by: Arabella Merles, PA-C Authorized by: Arabella Merles, PA-C   Critical care provider statement:    Critical care time (minutes):  30   Critical care was necessary to treat or prevent imminent or life-threatening deterioration of the following conditions:  Endocrine crisis (Severe hypokalemia and hypocalcemia)   Critical care was time spent personally by me on the following activities:  Development of treatment plan with patient or surrogate, discussions with consultants, evaluation of patient's response to treatment, examination of patient, obtaining history from patient or surrogate, re-evaluation of patient's condition, ordering and review of radiographic studies, ordering and review of laboratory studies, ordering and performing treatments and interventions and pulse oximetry   Care discussed with: admitting provider       Medications Ordered in ED Medications  potassium chloride 10 mEq in 100 mL IVPB (10 mEq Intravenous New Bag/Given 08/18/23 2138)  calcium gluconate 1 g/ 50 mL sodium chloride IVPB (1,000 mg Intravenous New Bag/Given 08/18/23 2138)  lactated ringers bolus 1,000 mL (1,000 mLs Intravenous New Bag/Given 08/18/23 2136)  potassium chloride (KLOR-CON) packet 40 mEq (40 mEq Oral Given 08/18/23 2138)    ED Course/ Medical Decision Making/ A&P                                 Medical Decision Making Amount and/or Complexity of Data Reviewed Labs: ordered. Radiology: ordered.  Risk Prescription drug management.     Differential diagnosis includes but is not limited to vasovagal syncope, orthostatic syncope, arrhythmia, electrolyte abnormality, anemia, pulmonary embolism, ACS  ED Course:  Patient very well-appearing, stable vital signs upon arrival.  She is oxygenating at 97% on her normal 2 L nasal cannula.  States she currently feels at baseline. I Ordered, and personally interpreted labs.  The pertinent results include:   CBG 103 CBC with low hemoglobin at 9.1.  Patient's last hemoglobin was at 12 taken 6 months  ago Hemoccult positive  CMP with hypokalemia at 2.7, hypocalcemia at 5.6 CT head without any acute abnormalities.  Chest x-ray with bilateral opacities, greater on the left side  Patient orthostats revealed blood pressure drops to 60/50 upon standing with tachycardia of 115.    Will add on LR bolus.  1g Calcium gluconate given for hypocalcemia 10 mEq KCl given IV.  40 mEq KCl given p.o. for hypokalemia  Upon re-evaluation, patient remains with  stable vitals.  Upon talking to her about the chest x-ray finding, she states she has had a chronic cough for the past couple of months, but no acute worsening.  She denies any fever or chills.  Does not seem this is infectious as she is denying any infectious symptoms at this time.  Initial troponin 6, EKG with normal sinus rhythm and no ST changes, patient denies any chest pain or new shortness of breath, no concern for ACS at this time.   Suspect her syncope was likely secondary to vasovagal causes as she was bearing down while having a BM.  However, given her significant electrolyte abnormalities, low hemoglobin and positive Hemoccult, and orthostatic hypotension, will plan on admission.   9:48 I was notified by nursing that her lactic is elevated at 2.2. She is already on fluids.    Impression: Syncope Hypokalemia Hypocalcemia Low hemoglobin with positive Hemoccult  Disposition:  Patient admitted with hospitalist Dr. Toniann Fail  Imaging Studies ordered: I ordered imaging studies including CT head, chest x-ray I independently visualized the imaging with scope of interpretation limited to determining acute life threatening conditions related to emergency care. Imaging showed  CT head with no acute abnormalities Bilateral airspace and interstitial opacities, consistent with chronic lung disease.  Increased in the left lung which is suspicious for superimposed edema or infection. I agree with the radiologist interpretation   Cardiac  Monitoring: / EKG: The patient was maintained on a cardiac monitor.  I personally viewed and interpreted the cardiac monitored which showed an underlying rhythm of: Normal sinus rhythm, no ST changes   Consultations Obtained: I requested consultation with the gastroenterology Dr. Loreta Ave,  and discussed lab and imaging findings as well as pertinent plan - they we will plan on seeing patient in consult in the morning  I requested consultation with the hospitalist Dr. Toniann Fail,  and discussed lab and imaging findings as well as pertinent plan - they recommend: admission for further management   External records from outside source obtained and reviewed including Previous CBC from 12/06/22 reviewed, hemoglobin 12              Final Clinical Impression(s) / ED Diagnoses Final diagnoses:  Low hemoglobin  Hypokalemia  Hypocalcemia  Syncope, unspecified syncope type  Orthostatic hypotension    Rx / DC Orders ED Discharge Orders     None         Arabella Merles, Cordelia Poche 08/18/23 2202    Wynetta Fines, MD 08/18/23 [id number]

## 2023-08-19 ENCOUNTER — Observation Stay (HOSPITAL_COMMUNITY)

## 2023-08-19 ENCOUNTER — Observation Stay (HOSPITAL_BASED_OUTPATIENT_CLINIC_OR_DEPARTMENT_OTHER)

## 2023-08-19 ENCOUNTER — Other Ambulatory Visit: Payer: Self-pay

## 2023-08-19 DIAGNOSIS — R55 Syncope and collapse: Secondary | ICD-10-CM

## 2023-08-19 DIAGNOSIS — E876 Hypokalemia: Secondary | ICD-10-CM | POA: Insufficient documentation

## 2023-08-19 LAB — COMPREHENSIVE METABOLIC PANEL WITH GFR
ALT: <5 U/L (ref 0–44)
AST: 18 U/L (ref 15–41)
Albumin: 2.9 g/dL — ABNORMAL LOW (ref 3.5–5.0)
Alkaline Phosphatase: 89 U/L (ref 38–126)
Anion gap: 6 (ref 5–15)
BUN: 11 mg/dL (ref 8–23)
CO2: 25 mmol/L (ref 22–32)
Calcium: 8.7 mg/dL — ABNORMAL LOW (ref 8.9–10.3)
Chloride: 107 mmol/L (ref 98–111)
Creatinine, Ser: 0.68 mg/dL (ref 0.44–1.00)
GFR, Estimated: >60 mL/min (ref >60)
Glucose, Bld: 99 mg/dL (ref 70–99)
Potassium: 5.3 mmol/L — ABNORMAL HIGH (ref 3.5–5.1)
Sodium: 138 mmol/L (ref 135–145)
Total Bilirubin: 0.7 mg/dL (ref 0.0–1.2)
Total Protein: 6.9 g/dL (ref 6.5–8.1)

## 2023-08-19 LAB — ECHOCARDIOGRAM COMPLETE
AR max vel: 2.56 cm2
AV Area VTI: 2.64 cm2
AV Area mean vel: 2.44 cm2
AV Mean grad: 3 mmHg
AV Peak grad: 4.5 mmHg
Ao pk vel: 1.06 m/s
Area-P 1/2: 2.97 cm2
Height: 64 in
MV VTI: 2.15 cm2
S' Lateral: 2.7 cm
Single Plane A4C EF: 60.7 %
Weight: 2303.37 [oz_av]

## 2023-08-19 LAB — RETICULOCYTES
Immature Retic Fract: 26.3 % — ABNORMAL HIGH (ref 2.3–15.9)
RBC.: 3.46 MIL/uL — ABNORMAL LOW (ref 3.87–5.11)
Retic Count, Absolute: 58.5 10*3/uL (ref 19.0–186.0)
Retic Ct Pct: 1.7 % (ref 0.4–3.1)

## 2023-08-19 LAB — CBC
HCT: 33.2 % — ABNORMAL LOW (ref 36.0–46.0)
HCT: 32 % — ABNORMAL LOW (ref 36.0–46.0)
Hemoglobin: 9.6 g/dL — ABNORMAL LOW (ref 12.0–15.0)
Hemoglobin: 9.2 g/dL — ABNORMAL LOW (ref 12.0–15.0)
MCH: 26.8 pg (ref 26.0–34.0)
MCH: 26.7 pg (ref 26.0–34.0)
MCHC: 28.9 g/dL — ABNORMAL LOW (ref 30.0–36.0)
MCHC: 28.8 g/dL — ABNORMAL LOW (ref 30.0–36.0)
MCV: 92.7 fL (ref 80.0–100.0)
MCV: 92.8 fL (ref 80.0–100.0)
Platelets: 199 10*3/uL (ref 150–400)
Platelets: 188 10*3/uL (ref 150–400)
RBC: 3.58 MIL/uL — ABNORMAL LOW (ref 3.87–5.11)
RBC: 3.45 MIL/uL — ABNORMAL LOW (ref 3.87–5.11)
RDW: 15 % (ref 11.5–15.5)
RDW: 15.4 % (ref 11.5–15.5)
WBC: 9.1 10*3/uL (ref 4.0–10.5)
WBC: 8.1 10*3/uL (ref 4.0–10.5)
nRBC: 0 % (ref 0.0–0.2)
nRBC: 0 % (ref 0.0–0.2)

## 2023-08-19 LAB — IRON AND TIBC
Iron: 26 ug/dL — ABNORMAL LOW (ref 28–170)
Saturation Ratios: 7 % — ABNORMAL LOW (ref 10.4–31.8)
TIBC: 364 ug/dL (ref 250–450)
UIBC: 338 ug/dL

## 2023-08-19 LAB — FOLATE: Folate: 6 ng/mL (ref >5.9)

## 2023-08-19 LAB — HIV ANTIBODY (ROUTINE TESTING W REFLEX): HIV Screen 4th Generation wRfx: NONREACTIVE

## 2023-08-19 LAB — VITAMIN B12: Vitamin B-12: 201 pg/mL (ref 180–914)

## 2023-08-19 LAB — FERRITIN: Ferritin: 15 ng/mL (ref 11–307)

## 2023-08-19 MED ORDER — IOHEXOL 350 MG/ML SOLN
75.0000 mL | Freq: Once | INTRAVENOUS | Status: AC | PRN
  Administered 2023-08-19: 75 mL via INTRAVENOUS

## 2023-08-19 NOTE — Discharge Summary (Signed)
 Physician Discharge Summary  [name] ZOX:[id number] DOB: 03-28-1951 DOA: 08/18/2023  PCP: Raynelle Jan., MD  Admit date: 08/18/2023 Discharge date: 08/19/2023  Admitted From: home Disposition:  home  Recommendations for Outpatient Follow-up:  Follow up with PCP in 1-2 weeks  Home Health: none Equipment/Devices: none  Discharge Condition: stable CODE STATUS: Full code  HPI: Per admitting MD, Kathy Lawrence is a 73 y.o. female with history of restless leg syndrome, depression, fibromyalgia, sleep apnea was brought to the ER after patient lost consciousness while on the commode.  Patient states she was straining on the commode when she lost consciousness.  She may have lost consciousness for about less than 2 minutes.  Her husband and her family members brought her to the hospital.  Denies any chest pain shortness of breath palpitation before the episode or after episode.  Has not noticed any black stools or blood in the stools.   Hospital Course / Discharge diagnoses: Principal Problem:   Syncope Active Problems:   OSA on CPAP   Restless leg syndrome   GERD (gastroesophageal reflux disease)   Anemia   GI bleeding   Hypokalemia   Hypomagnesemia   Principal problem Syncope -this is likely vasovagal related to straining while having bowel movements.  She was admitted to the hospital and monitored on telemetry, no acute events noted or arrhythmias.  She underwent a 2D echocardiogram which showed normal LVEF 60-65%, no WMA, grade 1 diastolic dysfunction.  It also showed right ventricular free wall hypokinesis, I discussed with cardiology and they are concerned about a pulmonary embolism.  The RV systolic function was reduced.  She had severely elevated PA pressure.  Given echo findings, underwent a CT angiogram which was negative for PE, but it did show her known ILD which has been progressive 2022.  Given no pulmonary embolus, clinically returned to baseline, RV dysfunction  seems to be chronic, she will be discharged home in stable condition with outpatient follow-up  Active problems ILD, pulmonary arterial hypertension, RV failure, chronic hypoxic respiratory failure-patient is established and seen by pulmonary as an outpatient at [facility].  Based on prior notes, she has known right heart failure and known pulmonary arterial hypertension, so it seems like the echocardiogram does not necessarily show acute findings.  I do not have access to prior echocardiograms to directly compare but she was advised to follow-up with PCP within a week Hypokalemia, hypomagnesemia -cause not entirely clear, I wonder whether there was a degree of lab error, since potassium improved more than expected with repletion. OSA-continue home BiPAP, she is not using it every night, discussed about compliance is of utmost importance given her lung and cardiac findings Anemia, positive fecal occult -this is likely incidental, she has no frank bleeding.  She tells me she has had a colonoscopy in the last year, which was clean per her report.  Outpatient follow-up Restless leg syndrome-takes Sinemet for that Depression-continue home Cymbalta  Sepsis ruled out   Discharge Instructions   Allergies as of 08/19/2023       Reactions   Codeine Other (See Comments)   hallucinations   Erythromycin Nausea And Vomiting, Rash        Medication List     TAKE these medications    albuterol 108 (90 Base) MCG/ACT inhaler Commonly known as: VENTOLIN HFA Inhale 2 puffs into the lungs every 6 (six) hours as needed for wheezing or shortness of breath.   carbidopa-levodopa 25-100 MG tablet Commonly known as: SINEMET IR  Take 1 tablet by mouth See admin instructions. Takes 1 tab in am and 1 tab in noon and 3 tabs in evening   DULoxetine 60 MG capsule Commonly known as: CYMBALTA Take 60 mg by mouth as directed. Take once daily along with 30 mg=90 mg   DULoxetine 30 MG capsule Commonly known as:  CYMBALTA Take 30 mg by mouth as directed. Take once daily along with 60 mg=90 mg   ibuprofen 200 MG tablet Commonly known as: ADVIL Take 400 mg by mouth every 6 (six) hours as needed for headache or moderate pain.   Leg Cramp Relief Tabs Take 1 tablet by mouth daily.   vitamin C 1000 MG tablet Take 1,000 mg by mouth once a week.   Vitamin D-1000 Max St 25 MCG (1000 UT) tablet Generic drug: Cholecalciferol Take 1,000 Units by mouth once a week.       Consultations: none  Procedures/Studies:  CT Angio Chest Pulmonary Embolism (PE) W or WO Contrast Result Date: 08/19/2023 CLINICAL DATA:  Pulmonary embolism (PE) suspected, high prob Syncope. EXAM: CT ANGIOGRAPHY CHEST WITH CONTRAST TECHNIQUE: Multidetector CT imaging of the chest was performed using the standard protocol during bolus administration of intravenous contrast. Multiplanar CT image reconstructions and MIPs were obtained to evaluate the vascular anatomy. RADIATION DOSE REDUCTION: This exam was performed according to the departmental dose-optimization program which includes automated exposure control, adjustment of the mA and/or kV according to patient size and/or use of iterative reconstruction technique. CONTRAST:  75mL OMNIPAQUE IOHEXOL 350 MG/ML SOLN COMPARISON:  Chest radiograph yesterday.  Chest CT 03/09/2021 FINDINGS: Cardiovascular: There are no filling defects within the pulmonary arteries to suggest pulmonary embolus. The main pulmonary arteries are dilated. The central pulmonary artery measures 3.9 cm. Heart is mildly enlarged. Contrast refluxes into the hepatic veins and IVC. Contrast refluxes into the left internal jugular vein. No pericardial effusion. Faint coronary artery calcifications. Minor aortic atherosclerosis. Mediastinum/Nodes: Shotty mediastinal lymph nodes, including 8 mm upper paratracheal node series 4, image 18. Bilateral hilar adenopathy. 13 mm right hilar node series 4, image 34. 10 mm left hilar node  series 4, image 29 additional mildly enlarged and prominent nodes. Moderate-sized hiatal hernia. Patulous esophagus. No thyroid nodule. Lungs/Pleura: Chronic left lung volume loss. Interstitial lung disease with subpleural reticulation, architectural distortion in ground-glass, with progression from 2022. There is basilar honeycombing. Increased density in the left upper lobe may represent progression of chronic lung disease or superimposed infection/inflammation. Peripheral nodular density in the right lung spanning the middle and lower lobe at the fissure measuring 12 x 16 mm, series 10, image 59. More consolidative than on prior exam. No pleural effusion. Upper Abdomen: No acute findings. Musculoskeletal: Thoracic spondylosis. There are no acute or suspicious osseous abnormalities. Review of the MIP images confirms the above findings. IMPRESSION: 1. No pulmonary embolus. 2. Interstitial lung disease with progression from 2022. Increased density in the left upper lobe may represent progression of chronic lung disease or superimposed infection/inflammation. 3. Peripheral nodular density in the right lung spanning the middle and lower lobe at the fissure measuring 12 x 16 mm, more consolidative than on prior exam. This may be related to chronic lung disease. Recommend CT follow-up in 3-6 months. 4. Mild cardiomegaly. Contrast refluxes into the hepatic veins and IVC, suggesting right heart dysfunction. 5. Dilated main pulmonary artery typical of pulmonary arterial hypertension. 6. Moderate-sized hiatal hernia. Electronically Signed   By: Narda Rutherford M.D.   On: 08/19/2023 15:13   ECHOCARDIOGRAM COMPLETE  Result Date: 08/19/2023    ECHOCARDIOGRAM REPORT   Patient Name:   Kathy Lawrence Date of Exam: 08/19/2023 Medical Rec #:  [id number]     Height:       748.0 in Accession #:    [id number]    Weight:       144.0 lb Date of Birth:  Aug 08, 1950     BSA:          10.111 m Patient Age:    72 years      BP:            117/70 mmHg Patient Gender: F             HR:           96 bpm. Exam Location:  Inpatient Procedure: 2D Echo, Cardiac Doppler and Color Doppler (Both Spectral and Color            Flow Doppler were utilized during procedure). Indications:    Syncope  History:        Patient has no prior history of Echocardiogram examinations.  Sonographer:    Amy Chionchio Referring Phys: [id number] ARSHAD N KAKRAKANDY IMPRESSIONS  1. Left ventricular ejection fraction, by estimation, is 60 to 65%. The left ventricle has normal function. The left ventricle has no regional wall motion abnormalities. Left ventricular diastolic parameters are consistent with Grade I diastolic dysfunction (impaired relaxation). There is the interventricular septum is flattened in systole, consistent with right ventricular pressure overload.  2. Right ventricular free wall hypokinesis with preserved apical contraction (McConnell's sign) suggests acute pulmonary embolism. Right ventricular systolic function is severely reduced. The right ventricular size is moderately enlarged. There is severely elevated pulmonary artery systolic pressure. The estimated right ventricular systolic pressure is 69.6 mmHg.  3. The mitral valve is normal in structure. No evidence of mitral valve regurgitation. No evidence of mitral stenosis.  4. The aortic valve is tricuspid. Aortic valve regurgitation is not visualized. No aortic stenosis is present.  5. The inferior vena cava is normal in size with greater than 50% respiratory variability, suggesting right atrial pressure of 3 mmHg. Conclusion(s)/Recommendation(s): The study is consistent with cor pulmonale. The presence of McConnell's sign suggest this could be due to acute pulmonary embolism, but the degree of elevation in pulmonary artery pressure suggest a more chronic cause of pulmonary hypertension. FINDINGS  Left Ventricle: Left ventricular ejection fraction, by estimation, is 60 to 65%. The left ventricle has normal  function. The left ventricle has no regional wall motion abnormalities. The left ventricular internal cavity size was normal in size. There is  no left ventricular hypertrophy. The interventricular septum is flattened in systole, consistent with right ventricular pressure overload. Left ventricular diastolic parameters are consistent with Grade I diastolic dysfunction (impaired relaxation). Normal left ventricular filling pressure. Right Ventricle: Right ventricular free wall hypokinesis with preserved apical contraction (McConnell's sign) suggests acute pulmonary embolism. The right ventricular size is moderately enlarged. No increase in right ventricular wall thickness. Right ventricular systolic function is severely reduced. There is severely elevated pulmonary artery systolic pressure. The tricuspid regurgitant velocity is 4.08 m/s, and with an assumed right atrial pressure of 3 mmHg, the estimated right ventricular systolic pressure is 69.6 mmHg. Left [facility]: Left atrial size was normal in size. Right [facility]: Right atrial size was normal in size. Pericardium: There is no evidence of pericardial effusion. Mitral Valve: The mitral valve is normal in structure. No evidence of mitral valve regurgitation. No evidence of mitral valve stenosis.  MV peak gradient, 3.3 mmHg. The mean mitral valve gradient is 1.0 mmHg. Tricuspid Valve: The tricuspid valve is normal in structure. Tricuspid valve regurgitation is mild . No evidence of tricuspid stenosis. Aortic Valve: The aortic valve is tricuspid. Aortic valve regurgitation is not visualized. No aortic stenosis is present. Aortic valve mean gradient measures 3.0 mmHg. Aortic valve peak gradient measures 4.5 mmHg. Aortic valve area, by VTI measures 2.64 cm. Pulmonic Valve: The pulmonic valve was normal in structure. Pulmonic valve regurgitation is not visualized. No evidence of pulmonic stenosis. Aorta: The aortic root is normal in size and structure. Venous: The inferior  vena cava is normal in size with greater than 50% respiratory variability, suggesting right atrial pressure of 3 mmHg. IAS/Shunts: No atrial level shunt detected by color flow Doppler.  LEFT VENTRICLE PLAX 2D LVIDd:         3.40 cm     Diastology LVIDs:         2.70 cm     LV e' medial:    5.84 cm/s LV PW:         0.90 cm     LV E/e' medial:  8.9 LV IVS:        0.80 cm     LV e' lateral:   7.46 cm/s LVOT diam:     2.10 cm     LV E/e' lateral: 7.0 LV SV:         42 LV SV Index:   4 LVOT Area:     3.46 cm  LV Volumes (MOD) LV vol d, MOD A4C: 39.4 ml LV vol s, MOD A4C: 15.5 ml LV SV MOD A4C:     39.4 ml RIGHT VENTRICLE            IVC RV Basal diam:  4.10 cm    IVC diam: 1.00 cm RV Mid diam:    4.30 cm RV S prime:     6.22 cm/s TAPSE (M-mode): 0.8 cm LEFT ATRIUM             Index       RIGHT ATRIUM           Index LA Vol (A2C):   26.8 ml 2.65 ml/m  RA Area:     15.30 cm LA Vol (A4C):   39.6 ml 3.92 ml/m  RA Volume:   43.15 ml  4.27 ml/m LA Biplane Vol: 32.8 ml 3.24 ml/m  AORTIC VALVE AV Area (Vmax):    2.56 cm AV Area (Vmean):   2.44 cm AV Area (VTI):     2.64 cm AV Vmax:           106.00 cm/s AV Vmean:          74.700 cm/s AV VTI:            0.160 m AV Peak Grad:      4.5 mmHg AV Mean Grad:      3.0 mmHg LVOT Vmax:         78.30 cm/s LVOT Vmean:        52.700 cm/s LVOT VTI:          0.122 m LVOT/AV VTI ratio: 0.76  AORTA Ao Root diam: 3.10 cm Ao Asc diam:  2.90 cm MITRAL VALVE               TRICUSPID VALVE MV Area (PHT): 2.97 cm    TR Peak grad:   66.6 mmHg MV Area VTI:   2.15 cm  TR Vmax:        408.00 cm/s MV Peak grad:  3.3 mmHg MV Mean grad:  1.0 mmHg    SHUNTS MV Vmax:       0.90 m/s    Systemic VTI:  0.12 m MV Vmean:      51.5 cm/s   Systemic Diam: 2.10 cm MV Decel Time: 255 msec MV E velocity: 52.10 cm/s MV A velocity: 70.30 cm/s MV E/A ratio:  0.74 Mihai Croitoru MD Electronically signed by Thurmon Fair MD Signature Date/Time: 08/19/2023/1:55:20 PM    Final    DG Chest Portable 1 View Result  Date: 08/18/2023 CLINICAL DATA:  Syncope.  Low blood pressure. EXAM: PORTABLE CHEST 1 VIEW COMPARISON:  11/09/2021 FINDINGS: Stable cardiomediastinal silhouette. Aortic atherosclerotic calcification. Increased bilateral hazy airspace and interstitial opacities. These have increased in the left lung compared with 11/09/2021. No large pleural effusion. No pneumothorax. No displaced rib fractures. IMPRESSION: Bilateral hazy airspace and interstitial opacities consistent with chronic lung disease. These have increased in the left lung and are suspicious for superimposed edema or infection. Electronically Signed   By: Minerva Fester M.D.   On: 08/18/2023 21:12   CT Head Wo Contrast Result Date: 08/18/2023 CLINICAL DATA:  Syncope/presyncope, cerebrovascular cause suspected. Syncope, hypotension EXAM: CT HEAD WITHOUT CONTRAST TECHNIQUE: Contiguous axial images were obtained from the base of the skull through the vertex without intravenous contrast. RADIATION DOSE REDUCTION: This exam was performed according to the departmental dose-optimization program which includes automated exposure control, adjustment of the mA and/or kV according to patient size and/or use of iterative reconstruction technique. COMPARISON:  09/02/2015 FINDINGS: Brain: Normal anatomic configuration. Parenchymal volume loss is commensurate with the patient's age. Mild periventricular white matter changes are present likely reflecting the sequela of small vessel ischemia. No abnormal intra or extra-axial mass lesion or fluid collection. No abnormal mass effect or midline shift. No evidence of acute intracranial hemorrhage or infarct. Ventricular size is normal. Cerebellum unremarkable. Vascular: No asymmetric hyperdense vasculature at the skull base. Skull: Intact Sinuses/Orbits: Paranasal sinuses are clear. Orbits are unremarkable. Other: Mastoid air cells and middle ear cavities are clear. IMPRESSION: 1. No acute intracranial hemorrhage or infarct.  2. Mild senescent change. Electronically Signed   By: Helyn Numbers M.D.   On: 08/18/2023 20:47     Subjective: - no chest pain, shortness of breath, no abdominal pain, nausea or vomiting.   Discharge Exam: BP 101/63 (BP Location: Left Arm)   Pulse 83   Temp 98.7 F (37.1 C) (Oral)   Resp 20   Ht 5\' 4"  (1.626 m)   Wt 65.3 kg   SpO2 93%   BMI 24.71 kg/m   General: Pt is alert, awake, not in acute distress Cardiovascular: RRR, S1/S2 +, no rubs, no gallops Respiratory: CTA bilaterally, no wheezing, no rhonchi Abdominal: Soft, NT, ND, bowel sounds + Extremities: no edema, no cyanosis    The results of significant diagnostics from this hospitalization (including imaging, microbiology, ancillary and laboratory) are listed below for reference.     Microbiology: No results found for this or any previous visit (from the past 240 hours).   Labs: Basic Metabolic Panel: Recent Labs  Lab 08/18/23 2007 08/19/23 0646  NA 141 138  K 2.7* 5.3*  CL 120* 107  CO2 19* 25  GLUCOSE 75 99  BUN 9 11  CREATININE 0.43* 0.68  CALCIUM 5.6* 8.7*  MG 1.5*  --   PHOS 2.4*  --    Liver Function  Tests: Recent Labs  Lab 08/18/23 2007 08/19/23 0646  AST 12* 18  ALT <5 <5  ALKPHOS 52 89  BILITOT 0.3 0.7  PROT 4.7* 6.9  ALBUMIN 2.0* 2.9*   CBC: Recent Labs  Lab 08/18/23 2007 08/18/23 2355 08/19/23 0646  WBC 8.9 9.1 8.1  NEUTROABS 6.4  --   --   HGB 9.1* 9.6* 9.2*  HCT 31.8* 33.2* 32.0*  MCV 92.7 92.7 92.8  PLT 168 199 188   CBG: Recent Labs  Lab 08/18/23 1952  GLUCAP 103*   Hgb A1c No results for input(s): "HGBA1C" in the last 72 hours. Lipid Profile No results for input(s): "CHOL", "HDL", "LDLCALC", "TRIG", "CHOLHDL", "LDLDIRECT" in the last 72 hours. Thyroid function studies No results for input(s): "TSH", "T4TOTAL", "T3FREE", "THYROIDAB" in the last 72 hours.  Invalid input(s): "FREET3" Urinalysis    Component Value Date/Time   COLORURINE YELLOW 09/02/2015  1522   APPEARANCEUR HAZY (A) 09/02/2015 1522   LABSPEC 1.014 09/02/2015 1522   PHURINE 5.5 09/02/2015 1522   GLUCOSEU NEGATIVE 09/02/2015 1522   HGBUR NEGATIVE 09/02/2015 1522   BILIRUBINUR NEGATIVE 09/02/2015 1522   KETONESUR NEGATIVE 09/02/2015 1522   PROTEINUR NEGATIVE 09/02/2015 1522   NITRITE NEGATIVE 09/02/2015 1522   LEUKOCYTESUR NEGATIVE 09/02/2015 1522    FURTHER DISCHARGE INSTRUCTIONS:   Get Medicines reviewed and adjusted: Please take all your medications with you for your next visit with your Primary MD   Laboratory/radiological data: Please request your Primary MD to go over all hospital tests and procedure/radiological results at the follow up, please ask your Primary MD to get all Hospital records sent to his/her office.   In some cases, they will be blood work, cultures and biopsy results pending at the time of your discharge. Please request that your primary care M.D. goes through all the records of your hospital data and follows up on these results.   Also Note the following: If you experience worsening of your admission symptoms, develop shortness of breath, life threatening emergency, suicidal or homicidal thoughts you must seek medical attention immediately by calling 911 or calling your MD immediately  if symptoms less severe.   You must read complete instructions/literature along with all the possible adverse reactions/side effects for all the Medicines you take and that have been prescribed to you. Take any new Medicines after you have completely understood and accpet all the possible adverse reactions/side effects.    Do not drive when taking Pain medications or sleeping medications (Benzodaizepines)   Do not take more than prescribed Pain, Sleep and Anxiety Medications. It is not advisable to combine anxiety,sleep and pain medications without talking with your primary care practitioner   Special Instructions: If you have smoked or chewed Tobacco  in the last  2 yrs please stop smoking, stop any regular Alcohol  and or any Recreational drug use.   Wear Seat belts while driving.   Please note: You were cared for by a hospitalist during your hospital stay. Once you are discharged, your primary care physician will handle any further medical issues. Please note that NO REFILLS for any discharge medications will be authorized once you are discharged, as it is imperative that you return to your primary care physician (or establish a relationship with a primary care physician if you do not have one) for your post hospital discharge needs so that they can reassess your need for medications and monitor your lab values.  Time coordinating discharge: 35 minutes  SIGNED:  Pamella Pert,  MD, PhD 08/19/2023, 3:47 PM

## 2023-08-19 NOTE — Progress Notes (Signed)
 PIV removed - site CDI. Discharge Summary printed and reviewed with patient and spouse at bedside - all questions/concerns addressed. Patient dressed and taken downstairs via wheelchair to private vehicle.

## 2023-08-19 NOTE — TOC Initial Note (Signed)
 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  Expected Discharge Plan: Home/Self Care Barriers to Discharge: No Barriers Identified   Patient Goals and CMS Choice Patient states their goals for this hospitalization and ongoing recovery are:: home CMS Medicare.gov Compare Post Acute Care list provided to:: Patient        Expected Discharge Plan and Services   Discharge Planning Services: CM Consult   Living arrangements for the past 2 months: Single Family Home Expected Discharge Date: 08/19/23               DME Arranged: N/A DME Agency: NA       HH Arranged: NA HH Agency: NA        Prior Living Arrangements/Services Living arrangements for the past 2 months: Single Family Home Lives with:: Spouse Patient language and need for interpreter reviewed:: Yes Do you feel safe going back to the place where you live?: Yes      Need for Family Participation in Patient Care: Yes (Comment) Care giver support system in place?: Yes (comment) Current home services: DME (rolator; home oxygen w/ [employer]) Criminal Activity/Legal Involvement Pertinent to Current Situation/Hospitalization: No - Comment as needed  Activities of Daily Living   ADL Screening (condition at time of admission) Independently performs ADLs?: No Does the patient have a NEW difficulty with  bathing/dressing/toileting/self-feeding that is expected to last >3 days?: No Does the patient have a NEW difficulty with getting in/out of bed, walking, or climbing stairs that is expected to last >3 days?: No Does the patient have a NEW difficulty with communication that is expected to last >3 days?: No Is the patient deaf or have difficulty hearing?: No Does the patient have difficulty seeing, even when wearing glasses/contacts?: No Does the patient have difficulty concentrating, remembering, or making decisions?: No  Permission Sought/Granted Permission sought to share information with : Case Manager Permission granted to share information with : Yes, Verbal Permission Granted  Share Information with NAME: Case Manager     Permission granted to share info w Relationship: Sinclair Arrazola (spouse) [phone]     Emotional Assessment Appearance:: Appears stated age Attitude/Demeanor/Rapport: Gracious Affect (typically observed): Accepting Orientation: : Oriented to Self, Oriented to Place, Oriented to  Time, Oriented to Situation Alcohol / Substance Use: Not Applicable Psych Involvement: No (comment)  Admission diagnosis:  Hypocalcemia [E83.51] Orthostatic hypotension [I95.1] Hypokalemia [E87.6] Syncope [R55] Low hemoglobin [D64.9] Syncope, unspecified syncope type [R55] Patient Active Problem List   Diagnosis Date Noted   Hypokalemia 08/19/2023   Hypomagnesemia 08/19/2023   Syncope 08/18/2023   Anemia 08/18/2023   GI bleeding 08/18/2023   Confusion 09/02/2015   OSA on CPAP 09/02/2015   Restless leg syndrome 09/02/2015   GERD (gastroesophageal reflux disease) 09/02/2015   PCP:  Raynelle Jan., MD Pharmacy:  [facility] - [place], [place] - [address] [place] [address] Phone: [phone] Fax: [phone]     Social Drivers of Health (SDOH) Social History: SDOH Screenings   Food Insecurity: No Food Insecurity (08/19/2023)   Housing: Low Risk  (08/19/2023)  Transportation Needs: No Transportation Needs (08/19/2023)  Utilities: Not At Risk (08/19/2023)  Social Connections: Moderately Integrated (08/19/2023)  Tobacco Use: High Risk (08/18/2023)   SDOH Interventions: Food Insecurity Interventions: Intervention Not Indicated, Inpatient TOC Housing Interventions: Intervention Not Indicated, Inpatient TOC Transportation Interventions: Intervention Not Indicated, Inpatient TOC Utilities Interventions: Intervention Not Indicated, Inpatient TOC   Readmission Risk Interventions     No data to display

## 2023-08-19 NOTE — Progress Notes (Signed)
   08/19/23 0430  BiPAP/CPAP/SIPAP  $ Non-Invasive Ventilator   (Pt states she wore her cpap couple months ago not consistent at home. Pts husband will bring the tubing and mask and then pt will try tonight.)  BiPAP/CPAP /SiPAP Vitals  Temp 98 F (36.7 C)  Resp 18  BP 106/61  SpO2 93 %  MEWS Score/Color  MEWS Score 0  MEWS Score Color Green

## 2023-08-19 NOTE — Care Management Obs Status (Signed)
 MEDICARE OBSERVATION STATUS NOTIFICATION   Patient Details  Name: Kathy Lawrence MRN: [id number] Date of Birth: Sep 15, 1950   Medicare Observation Status Notification Given:  Yes    Adrian Prows, RN 08/19/2023, 3:28 PM

## 2023-08-21 LAB — CALCIUM, IONIZED: Calcium, Ionized, Serum: 5.1 mg/dL (ref 4.5–5.6)

## 2023-10-22 DEATH — deceased
